# Patient Record
Sex: Female | Born: 2000 | Race: Black or African American | Hispanic: No | Marital: Single | Smoking: Current some day smoker
Health system: Southern US, Community
[De-identification: ages and names within clinical notes are randomized; demographics above are authoritative.]

## PROBLEM LIST (undated history)

## (undated) DIAGNOSIS — Z800 Family history of malignant neoplasm of digestive organs: Secondary | ICD-10-CM

## (undated) DIAGNOSIS — K649 Unspecified hemorrhoids: Secondary | ICD-10-CM

## (undated) DIAGNOSIS — F419 Anxiety disorder, unspecified: Secondary | ICD-10-CM

## (undated) DIAGNOSIS — F32A Depression, unspecified: Secondary | ICD-10-CM

## (undated) DIAGNOSIS — Z15068 Genetic susceptibility to other malignant neoplasm of digestive system: Secondary | ICD-10-CM

## (undated) DIAGNOSIS — Z1509 Genetic susceptibility to other malignant neoplasm: Secondary | ICD-10-CM

## (undated) DIAGNOSIS — T7840XA Allergy, unspecified, initial encounter: Secondary | ICD-10-CM

## (undated) HISTORY — DX: Allergy, unspecified, initial encounter: T78.40XA

## (undated) HISTORY — DX: Unspecified hemorrhoids: K64.9

## (undated) HISTORY — DX: Anxiety disorder, unspecified: F41.9

## (undated) HISTORY — PX: TONSILLECTOMY: SUR1361

## (undated) HISTORY — DX: Genetic susceptibility to other malignant neoplasm of digestive system: Z15.068

## (undated) HISTORY — DX: Family history of malignant neoplasm of digestive organs: Z80.0

## (undated) HISTORY — DX: Depression, unspecified: F32.A

## (undated) HISTORY — DX: Genetic susceptibility to other malignant neoplasm: Z15.09

---
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
## 2018-02-01 ENCOUNTER — Ambulatory Visit: Payer: 59 | Admitting: Internal Medicine

## 2018-02-09 ENCOUNTER — Ambulatory Visit: Payer: 59 | Admitting: Internal Medicine

## 2018-02-09 DIAGNOSIS — Z0289 Encounter for other administrative examinations: Secondary | ICD-10-CM

## 2018-02-22 ENCOUNTER — Ambulatory Visit (INDEPENDENT_AMBULATORY_CARE_PROVIDER_SITE_OTHER): Payer: 59

## 2018-02-22 DIAGNOSIS — Z23 Encounter for immunization: Secondary | ICD-10-CM

## 2018-02-24 ENCOUNTER — Ambulatory Visit: Payer: Self-pay | Admitting: Nurse Practitioner

## 2018-03-02 ENCOUNTER — Ambulatory Visit: Payer: 59 | Admitting: Nurse Practitioner

## 2018-03-02 ENCOUNTER — Encounter: Payer: Self-pay | Admitting: Nurse Practitioner

## 2018-03-02 VITALS — BP 120/90 | HR 100 | Temp 98.2°F | Ht 64.03 in | Wt 128.0 lb

## 2018-03-02 DIAGNOSIS — K149 Disease of tongue, unspecified: Secondary | ICD-10-CM

## 2018-03-02 NOTE — Progress Notes (Signed)
Alicia Li is a 17 y.o. female with the following history as recorded in [facility]:  Patient Active Problem List   Diagnosis Date Noted  . Elevated LFTs 07/14/2017  . Fatigue 07/13/2017  . Neurodermatitis 06/22/2017  . Psychophysiological insomnia 06/21/2017  . MDD (major depressive disorder), recurrent severe, without psychosis ([place]) 04/14/2017  . Family history of Lynch syndrome   . Family history of colon cancer   . Neuropathy involving both lower extremities 05/26/2016  . Routine general medical examination at a health care facility 01/30/2013    Current Outpatient Medications  Medication Sig Dispense Refill  . clindamycin (CLINDAGEL) 1 % gel     . tretinoin (RETIN-A) 0.025 % gel      No current facility-administered medications for this visit.     Allergies: Gabapentin  Past Medical History:  Diagnosis Date  . Family history of colon cancer   . Family history of Lynch syndrome     History reviewed. No pertinent surgical history.  Family History  Problem Relation Age of Onset  . Colon polyps Mother   . Other Mother        Lynch syndrome - PMS2 pos  . Colon cancer Maternal Grandfather 38  . Heart disease Paternal Grandfather   . Other Maternal Aunt        PMS2 neg  . Colon cancer Other 33       MGFs brother  . Colon cancer Other 68       MGF's brother  . Colon cancer Other 56       MGFs sister - MLH1 pos    Social History   Tobacco Use  . Smoking status: Never Smoker  . Smokeless tobacco: Never Used  . Tobacco comment: patient admitted to smoking 1 black and white cigar/day  Substance Use Topics  . Alcohol use: No     Subjective:  Ms grade is here today for evaluation of acute complaint of "bleeding tongue." she tells me that she has always had "bumps" on the back of her tongue and sometimes her tongue "looks white" she saw her dentist for this and was told to brush tongue regularly, which she has started doing but notices her tongue bleeds after she  brushes it, also using tongue scraper. The bleeding last for short time and stops. There is no bleeding now.  She denies fevers, chills, trouble swallowing, loss of appetite, tongue pain, mouth pain.  ROS- see HPI  Objective:  Vitals:   03/02/18 1434 03/02/18 1458  BP: 120/90   Pulse: (!) 108 100  Temp: 98.2 F (36.8 C)   TempSrc: Oral   SpO2: 99%   Weight: 128 lb (58.1 kg)   Height: 5' 4.03" (1.626 m)     General: Well developed, well nourished, in no acute distress  Skin : Warm and dry.  Head: Normocephalic and atraumatic  Eyes: Sclera and conjunctiva clear; pupils round and reactive to light; extraocular movements intact  Mouth/Throat: Oropharynx is clear and moist. No oral lesions. No uvula swelling. tongue is not bleeding or inflamed. Neck: Supple Lungs: effort unlabored, no respiratory distress CVS exam: normal rate and regular rhythm.  Extremities: No edema, cyanosis Vessels: Symmetric bilaterally  Neurologic: Alert and oriented; speech intact; face symmetrical; moves all extremities well; CNII-XII intact without focal deficit  Psychiatric: Normal mood and affect.  Assessment:  1. Tongue irritation     Plan:   Normal PE today Suspect bleeding is related to mechanical irritation She was instructed to hold tongue  brushing/scraping for 2-3 days, resume gently only She will f/u for new, worsening or persistent symptoms Will consider ENT referral if tongue bleeding persists  No follow-ups on file.  No orders of the defined types were placed in this encounter.   Requested Prescriptions    No prescriptions requested or ordered in this encounter

## 2018-03-02 NOTE — Patient Instructions (Addendum)
Hold off on tongue brushing/scraping for a few days  When you resume, gently brush.  Please follow up if you develop pain, if bleeding persists.

## 2018-11-22 ENCOUNTER — Other Ambulatory Visit: Payer: Self-pay

## 2018-11-22 ENCOUNTER — Other Ambulatory Visit (INDEPENDENT_AMBULATORY_CARE_PROVIDER_SITE_OTHER): Payer: 59

## 2018-11-22 ENCOUNTER — Ambulatory Visit (INDEPENDENT_AMBULATORY_CARE_PROVIDER_SITE_OTHER): Payer: 59 | Admitting: Internal Medicine

## 2018-11-22 ENCOUNTER — Encounter: Payer: Self-pay | Admitting: Internal Medicine

## 2018-11-22 VITALS — BP 121/78 | HR 90 | Temp 98.2°F | Resp 16 | Ht 64.8 in | Wt 126.5 lb

## 2018-11-22 DIAGNOSIS — R945 Abnormal results of liver function studies: Secondary | ICD-10-CM

## 2018-11-22 DIAGNOSIS — R7989 Other specified abnormal findings of blood chemistry: Secondary | ICD-10-CM

## 2018-11-22 DIAGNOSIS — Z23 Encounter for immunization: Secondary | ICD-10-CM

## 2018-11-22 DIAGNOSIS — B001 Herpesviral vesicular dermatitis: Secondary | ICD-10-CM | POA: Insufficient documentation

## 2018-11-22 LAB — HEPATIC FUNCTION PANEL
ALT: 10 U/L (ref 0–35)
AST: 15 U/L (ref 0–37)
Albumin: 5 g/dL (ref 3.5–5.2)
Alkaline Phosphatase: 51 U/L (ref 47–119)
Bilirubin, Direct: 0.1 mg/dL (ref 0.0–0.3)
Total Bilirubin: 0.9 mg/dL (ref 0.2–1.2)
Total Protein: 8.1 g/dL (ref 6.0–8.3)

## 2018-11-22 MED ORDER — VALACYCLOVIR HCL 1 G PO TABS: 2000.0000 mg | ORAL_TABLET | Freq: Two times a day (BID) | ORAL | 1 refills | Status: AC

## 2018-11-22 NOTE — Patient Instructions (Signed)
Cold Sore  A cold sore, also called a fever blister, is a small, fluid-filled sore that forms inside the mouth or on the lips, gums, nose, chin, or cheeks. Cold sores can spread to other parts of the body, such as the eyes or fingers. In some people who have other medical conditions, cold sores can spread to multiple other body sites, including the genitals. Cold sores can spread from person to person (are contagious) until the sores crust over completely. Most cold sores go away within 2 weeks. What are the causes? Cold sores are caused by an infection from a common type of herpes simplex virus (HSV-1). HSV-1 is closely related to the HSV-2virus, which is the virus that causes genital herpes, but these viruses are not the same. Once a person is infected with HSV-1, the virus remains permanently in the body. HSV-1 is spread from person to person through close contact, such as through kissing, touching the affected area, or sharing personal items such as lip balm, razors, a drinking glass, or eating utensils. What increases the risk? You are more likely to develop this condition if you:  Are tired, stressed, or sick.  Are menstruating.  Are pregnant.  Take certain medicines.  Are exposed to cold weather or too much sun. What are the signs or symptoms? Symptoms of a cold sore outbreak go through different stages. These are the stages of a cold sore:  Tingling, itching, or burning is felt 1-2 days before the outbreak.  Fluid-filled blisters appear on the lips, inside the mouth, on the nose, or on the cheeks.  The blisters start to ooze clear fluid.  The blisters dry up, and a yellow crust appears in their place.  The crust falls off. In some cases, other symptoms can develop during a cold sore outbreak. These can include:  Fever.  Sore throat.  Headache.  Muscle aches.  Swollen neck glands. How is this diagnosed? This condition is diagnosed based on your medical history and a  physical exam. Your health care provider may do a blood test or may swab some fluid from your sore and then examine the swab in the lab. How is this treated? There is no cure for cold sores or HSV-1. There is also no vaccine for HSV-1. Most cold sores go away on their own without treatment within 2 weeks. Medicines cannot make the infection go away, but your health care provider may prescribe medicines to:  Help relieve some of the pain associated with the sores.  Work to stop the virus from multiplying.  Shorten healing time. Medicines may be in the form of creams, gels, pills, or a shot. Follow these instructions at home: Medicines  Take or apply over-the-counter and prescription medicines only as told by your health care provider.  Use a cotton-tip swab to apply creams or gels to your sores.  Ask your health care provider if you can take lysine supplements. Research has found that lysine may help heal the cold sore faster and prevent outbreaks. Sore care   Do not touch the sores or pick the scabs.  Wash your hands often. Do not touch your eyes without washing your hands first.  Keep the sores clean and dry.  If directed, apply ice to the sores: ? Put ice in a plastic bag. ? Place a towel between your skin and the bag. ? Leave the ice on for 20 minutes, 2-3 times a day. Eating and drinking  Eat a soft, bland diet. Avoid eating   hot, cold, or salty foods.  Use a straw if it hurts to drink out of a glass.  Eat foods that are rich in lysine, such as meat, fish, and dairy products.  Avoid sugary foods, chocolates, nuts, and grains. These foods are rich in a nutrient called arginine, which can cause the virus to multiply. Lifestyle  Do not kiss, have oral sex, or share personal items until your sores heal.  Stress, poor sleep, and being out in the sun can trigger outbreaks. Make sure you: ? Do activities that help you relax, such as deep breathing exercises or meditation. ?  Get enough sleep. ? Apply sunscreen on your lips before you go out in the sun. Contact a health care provider if:  You have symptoms for more than 2 weeks.  You have pus coming from the sores.  You have redness that is spreading.  You have pain or irritation in your eye.  You get sores on your genitals.  Your sores do not heal within 2 weeks.  You have frequent cold sore outbreaks. Get help right away if you have:  A fever and your symptoms suddenly get worse.  A headache and confusion.  Fatigue or loss of appetite.  A stiff neck or sensitivity to light. Summary  A cold sore, also called a fever blister, is a small, fluid-filled sore that forms inside the mouth or on the lips, gums, nose, chin, or cheeks.  Most cold sores go away on their own without treatment within 2 weeks. Your health care provider may prescribe medicines to help relieve some of the pain, work to stop the virus from multiplying, and shorten healing time.  Wash your hands often. Do not touch your eyes without washing your hands first.  Do not kiss, have oral sex, or share personal items until your sores heal.  Contact a health care provider if your sores do not heal within 2 weeks. This information is not intended to replace advice given to you by your health care provider. Make sure you discuss any questions you have with your health care provider. Document Released: 03/06/2000 Document Revised: 06/29/2018 Document Reviewed: 08/09/2017 Elsevier Patient Education  2020 [employer].

## 2018-11-22 NOTE — Progress Notes (Signed)
Subjective:  Patient ID: Alicia Li, female    DOB: 05/19/2000  Age: 18 y.o. MRN: [id number]  CC: Rash   HPI Kara Drilling presents for f/up - She has a history of elevated liver enzymes.  She denies any recent episodes of abdominal pain, nausea, vomiting, or fatigue.  She does not drink alcohol.  She complains that she breaks out in cold sores on her lower lips when she gets her menstrual cycle.  The last episode was a few weeks ago.  Outpatient Medications Prior to Visit  Medication Sig Dispense Refill  . clindamycin (CLINDAGEL) 1 % gel     . tretinoin (RETIN-A) 0.025 % gel      No facility-administered medications prior to visit.     ROS Review of Systems  Constitutional: Negative.  Negative for chills, fatigue and fever.  HENT: Negative.   Eyes: Negative for visual disturbance.  Respiratory: Negative for cough, chest tightness, shortness of breath and wheezing.   Cardiovascular: Negative for chest pain, palpitations and leg swelling.  Gastrointestinal: Negative for abdominal pain, diarrhea, nausea and vomiting.  Endocrine: Negative.   Genitourinary: Negative.  Negative for difficulty urinating.  Musculoskeletal: Negative.  Negative for arthralgias.  Skin: Negative.  Negative for color change and pallor.  Neurological: Negative.  Negative for dizziness, weakness and light-headedness.  Hematological: Negative for adenopathy. Does not bruise/bleed easily.  Psychiatric/Behavioral: Negative.     Objective:  BP 121/78 (BP Location: Right Arm, Patient Position: Sitting, Cuff Size: Small)   Pulse 90   Temp 98.2 F (36.8 C) (Tympanic)   Resp 16   Ht 5' 4.8" (1.646 m)   Wt 126 lb 8 oz (57.4 kg)   LMP 11/05/2018   SpO2 99%   BMI 21.18 kg/m   BP Readings from Last 3 Encounters:  11/22/18 121/78  03/02/18 120/90  07/13/17 120/64    Wt Readings from Last 3 Encounters:  11/22/18 126 lb 8 oz (57.4 kg) (47 %, Z= -0.09)*  03/02/18 128 lb (58.1 kg) (52 %, Z= 0.05)*   07/13/17 139 lb (63 kg) (72 %, Z= 0.58)*   * Growth percentiles are based on CDC (Girls, 2-20 Years) data.    Physical Exam Vitals signs reviewed.  HENT:     Nose: Nose normal.     Mouth/Throat:     Mouth: Mucous membranes are moist.     Pharynx: No oropharyngeal exudate.  Eyes:     General: No scleral icterus. Neck:     Musculoskeletal: Normal range of motion. No neck rigidity or muscular tenderness.  Cardiovascular:     Rate and Rhythm: Normal rate and regular rhythm.     Heart sounds: No murmur.  Pulmonary:     Effort: Pulmonary effort is normal.     Breath sounds: No stridor. No wheezing, rhonchi or rales.  Abdominal:     General: Abdomen is flat. Bowel sounds are normal. There is no distension.     Palpations: There is no hepatomegaly or splenomegaly.     Tenderness: There is no abdominal tenderness.  Musculoskeletal: Normal range of motion.     Right lower leg: No edema.     Left lower leg: No edema.  Lymphadenopathy:     Cervical: No cervical adenopathy.  Skin:    General: Skin is warm and dry.     Findings: Rash present.     Comments: There are excoriated vesicles on the vermilion border laterally over the lower lip.  Neurological:     General:  No focal deficit present.     Mental Status: She is alert.  Psychiatric:        Mood and Affect: Mood normal.        Behavior: Behavior normal.     Lab Results  Component Value Date   WBC 8.0 07/13/2017   HGB 13.7 07/13/2017   HCT 41.8 07/13/2017   PLT 274.0 07/13/2017   GLUCOSE 59 (L) 07/13/2017   ALT 10 11/22/2018   AST 15 11/22/2018   NA 139 07/13/2017   K 3.6 07/13/2017   CL 103 07/13/2017   CREATININE 0.85 07/13/2017   BUN 16 07/13/2017   CO2 28 07/13/2017   TSH 3.12 07/13/2017    Dg Finger Index Right  Result Date: 05/18/2017 CLINICAL DATA:  Jammed RIGHT index finger, MCP joint pain for 2 months EXAM: RIGHT INDEX FINGER 2+V COMPARISON:  None FINDINGS: Osseous mineralization normal. Joint spaces  preserved. No acute fracture, dislocation, or bone destruction. IMPRESSION: Normal exam. Electronically Signed   By: Lavonia Dana M.D.   On: 05/18/2017 16:45   Dg Finger Middle Right  Result Date: 05/18/2017 CLINICAL DATA:  Pain at proximal portion of RIGHT middle phalanx for 2 months, trauma about 3 weeks ago EXAM: RIGHT MIDDLE FINGER 2+V COMPARISON:  None FINDINGS: Osseous mineralization normal. Joint spaces preserved. No acute fracture, dislocation, or bone destruction. IMPRESSION: Normal exam. Electronically Signed   By: Lavonia Dana M.D.   On: 05/18/2017 16:46    Assessment & Plan:   Tametha was seen today for rash.  Diagnoses and all orders for this visit:  Elevated LFTs- Her liver enzymes are normal now.  Screening for viral hepatitis is negative.  She has adequate immunity to hep A and B. -     Hepatic function panel; Future -     Hepatitis B surface antigen; Future -     Hepatitis B surface antibody,quantitative; Future -     Hepatitis A antibody, total; Future -     Hepatitis B core antibody, total; Future -     Hepatitis C antibody; Future  Herpes labialis without complication -     valACYclovir (VALTREX) 1000 MG tablet; Take 2 tablets (2,000 mg total) by mouth 2 (two) times daily for 1 day.  Need for influenza vaccination -     Flu Vaccine QUAD 36+ mos IM   I am having [name] start on valACYclovir. I am also having her maintain her tretinoin and clindamycin.  Meds ordered this encounter  Medications  . valACYclovir (VALTREX) 1000 MG tablet    Sig: Take 2 tablets (2,000 mg total) by mouth 2 (two) times daily for 1 day.    Dispense:  12 tablet    Refill:  1     Follow-up: Return if symptoms worsen or fail to improve.  [name], MD

## 2018-11-23 ENCOUNTER — Encounter: Payer: Self-pay | Admitting: Internal Medicine

## 2018-11-23 LAB — HEPATITIS A ANTIBODY, TOTAL: Hepatitis A AB,Total: REACTIVE — AB

## 2018-11-23 LAB — HEPATITIS B SURFACE ANTIBODY, QUANTITATIVE: Hep B S AB Quant (Post): 60 m[IU]/mL (ref >=10)

## 2018-11-23 LAB — HEPATITIS B CORE ANTIBODY, TOTAL: Hep B Core Total Ab: NONREACTIVE

## 2018-11-23 LAB — HEPATITIS C ANTIBODY
Hepatitis C Ab: NONREACTIVE
SIGNAL TO CUT-OFF: 0.01 (ref <1.00)

## 2018-11-23 LAB — HEPATITIS B SURFACE ANTIGEN: Hepatitis B Surface Ag: NONREACTIVE
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
## 2019-02-23 ENCOUNTER — Other Ambulatory Visit: Payer: Self-pay

## 2019-02-23 DIAGNOSIS — Z20822 Contact with and (suspected) exposure to covid-19: Secondary | ICD-10-CM

## 2019-02-26 LAB — NOVEL CORONAVIRUS, NAA: SARS-CoV-2, NAA: NOT DETECTED

## 2019-03-14 ENCOUNTER — Other Ambulatory Visit: Payer: 59

## 2019-03-14 ENCOUNTER — Ambulatory Visit: Payer: 59 | Attending: Internal Medicine

## 2019-03-14 DIAGNOSIS — Z20822 Contact with and (suspected) exposure to covid-19: Secondary | ICD-10-CM

## 2019-03-15 LAB — NOVEL CORONAVIRUS, NAA: SARS-CoV-2, NAA: NOT DETECTED

## 2019-04-03 ENCOUNTER — Ambulatory Visit (INDEPENDENT_AMBULATORY_CARE_PROVIDER_SITE_OTHER): Payer: 59 | Admitting: Psychology

## 2019-04-03 DIAGNOSIS — F411 Generalized anxiety disorder: Secondary | ICD-10-CM

## 2019-04-06 ENCOUNTER — Encounter: Payer: Self-pay | Admitting: Internal Medicine

## 2019-04-12 ENCOUNTER — Ambulatory Visit: Payer: 59 | Admitting: Psychology

## 2019-04-28 ENCOUNTER — Other Ambulatory Visit: Payer: 59

## 2019-05-03 ENCOUNTER — Other Ambulatory Visit: Payer: 59

## 2019-05-23 ENCOUNTER — Other Ambulatory Visit: Payer: Self-pay

## 2019-05-23 ENCOUNTER — Encounter: Payer: Self-pay | Admitting: Family

## 2019-05-23 ENCOUNTER — Ambulatory Visit (INDEPENDENT_AMBULATORY_CARE_PROVIDER_SITE_OTHER): Payer: 59 | Admitting: Family

## 2019-05-23 VITALS — BP 120/82 | HR 97 | Temp 98.0°F | Ht 64.8 in | Wt 117.6 lb

## 2019-05-23 DIAGNOSIS — R29818 Other symptoms and signs involving the nervous system: Secondary | ICD-10-CM | POA: Diagnosis not present

## 2019-05-23 DIAGNOSIS — S0990XA Unspecified injury of head, initial encounter: Secondary | ICD-10-CM
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
## 2019-05-24 ENCOUNTER — Other Ambulatory Visit: Payer: 59

## 2019-05-24 ENCOUNTER — Ambulatory Visit (INDEPENDENT_AMBULATORY_CARE_PROVIDER_SITE_OTHER)
Admission: RE | Admit: 2019-05-24 | Discharge: 2019-05-24 | Disposition: A | Discharge status: Home / Self Care | Payer: 59 | Source: Ambulatory Visit | Attending: Family | Admitting: Family

## 2019-05-24 DIAGNOSIS — R29818 Other symptoms and signs involving the nervous system: Secondary | ICD-10-CM | POA: Diagnosis not present

## 2019-08-17 ENCOUNTER — Ambulatory Visit: Payer: 59 | Attending: Internal Medicine

## 2019-08-30 ENCOUNTER — Ambulatory Visit: Payer: 59 | Admitting: Family

## 2019-08-31 ENCOUNTER — Ambulatory Visit: Payer: 59 | Admitting: Internal Medicine

## 2019-08-31 DIAGNOSIS — Z0289 Encounter for other administrative examinations: Secondary | ICD-10-CM

## 2019-11-28 ENCOUNTER — Ambulatory Visit: Payer: 59 | Admitting: Neurology

## 2020-01-16 ENCOUNTER — Ambulatory Visit: Payer: 59 | Admitting: Neurology

## 2020-01-16 ENCOUNTER — Telehealth: Payer: Self-pay | Admitting: Neurology

## 2020-01-16 ENCOUNTER — Encounter: Payer: Self-pay | Admitting: Neurology

## 2020-01-16 NOTE — Telephone Encounter (Signed)
This patient did not show for a RV appointment today.

## 2020-04-05 ENCOUNTER — Other Ambulatory Visit: Payer: 59

## 2020-04-06 ENCOUNTER — Other Ambulatory Visit: Payer: 59

## 2020-04-11 ENCOUNTER — Other Ambulatory Visit: Payer: 59

## 2020-04-11 ENCOUNTER — Other Ambulatory Visit: Payer: Self-pay

## 2020-04-11 DIAGNOSIS — Z20822 Contact with and (suspected) exposure to covid-19: Secondary | ICD-10-CM

## 2020-04-13 LAB — NOVEL CORONAVIRUS, NAA: SARS-CoV-2, NAA: NOT DETECTED

## 2020-04-13 LAB — SARS-COV-2, NAA 2 DAY TAT

## 2020-05-10 ENCOUNTER — Other Ambulatory Visit: Payer: Self-pay

## 2020-05-10 ENCOUNTER — Telehealth: Payer: 59 | Admitting: Family

## 2020-05-10 ENCOUNTER — Telehealth (INDEPENDENT_AMBULATORY_CARE_PROVIDER_SITE_OTHER): Payer: 59 | Admitting: Family Medicine

## 2020-05-10 DIAGNOSIS — J029 Acute pharyngitis, unspecified: Secondary | ICD-10-CM | POA: Diagnosis not present

## 2020-05-10 DIAGNOSIS — R599 Enlarged lymph nodes, unspecified: Secondary | ICD-10-CM | POA: Diagnosis not present

## 2020-05-10 NOTE — Progress Notes (Signed)
Virtual Visit via Video Note  I connected with Alicia Li  on 05/10/20 at  3:00 PM EST by a video enabled telemedicine application and verified that I am speaking with the correct person using two identifiers.  Location patient: home,  Location provider:work or home office Persons participating in the virtual visit: patient, provider  I discussed the limitations of evaluation and management by telemedicine and the availability of in person appointments. The patient expressed understanding and agreed to proceed.   HPI:  Acute telemedicine visit for a sore throat: -Onset: 2 days ago -Symptoms include: sore throat and swollen glands in ant neck -did a covid home test yesterday which was negative -Denies: fevers, cough, nasal congestion, inability to eat/drink, SOB, sick contacts -Has tried:analgesic -Pertinent past medical history: tonsillectomy -Pertinent medication allergies: gabapentin -COVID-19 vaccine status: x 2 doses  ROS: See pertinent positives and negatives per HPI.  Past Medical History:  Diagnosis Date  . Family history of colon cancer   . Family history of Lynch syndrome     No past surgical history on file.   Current Outpatient Medications:  .  clindamycin (CLINDAGEL) 1 % gel, , Disp: , Rfl:  .  tretinoin (RETIN-A) 0.025 % gel, , Disp: , Rfl:   EXAM:  VITALS per patient if applicable:  GENERAL: alert, oriented, appears well and in no acute distress  HEENT: atraumatic, conjunttiva clear, no obvious abnormalities on inspection of external nose and ears, on limited video visit exam, has some posterior oropharyngeal erythema, mucous membranes are moist  NECK: normal movements of the head and neck, on patient self exam she notes tender lymphadenopathy bilaterally in the anterior cervical region  LUNGS: on inspection no signs of respiratory distress, breathing rate appears normal, no obvious gross SOB, gasping or wheezing  CV: no obvious cyanosis  MS: moves all  visible extremities without noticeable abnormality  PSYCH/NEURO: pleasant and cooperative, no obvious depression or anxiety, speech and thought processing grossly intact  ASSESSMENT AND PLAN:  Discussed the following assessment and plan:  Sore throat  Glands swollen  -we discussed possible serious and likely etiologies, options for evaluation and workup, limitations of telemedicine visit vs in person visit, treatment, treatment risks and precautions. Pt prefers to treat via telemedicine empirically rather than in person at this moment.  Query viral pharyngitis, COVID-19, mononucleosis, versus other.  Strep throat a possibility.  She wants an accurate diagnosis and has opted to [facility] for further evaluation after lengthy discussion various etiologies, testing options and treatment.  . Discussed options for inperson care if PCP office not available. Did let this patient know that I only do telemedicine on Tuesdays and Thursdays for [facility]. Advised to schedule follow up visit with PCP or [facility] if any further questions or concerns to avoid delays in care.   I discussed the assessment and treatment plan with the patient. The patient was provided an opportunity to ask questions and all were answered. The patient agreed with the plan and demonstrated an understanding of the instructions.     Lucretia Kern, DO

## 2020-05-10 NOTE — Progress Notes (Signed)
I tried on multiple occasions to reach out to the patient for her virtual visit scheduled at 1:20 today. She was sent multiple links to join the visit through [facility]. Her cell phone did not allow for VM. I left a message around 2:20 pm on the home phone to call back if she still needed the appointment. I call back one more time at 4:20 and was still unable to reach the patient.

## 2020-05-15 ENCOUNTER — Encounter (HOSPITAL_COMMUNITY): Payer: Self-pay | Admitting: Emergency Medicine

## 2020-05-15 ENCOUNTER — Ambulatory Visit (HOSPITAL_COMMUNITY)
Admission: EM | Admit: 2020-05-15 | Discharge: 2020-05-15 | Disposition: A | Discharge status: Home / Self Care | Payer: 59 | Attending: Urgent Care | Admitting: Urgent Care

## 2020-05-15 ENCOUNTER — Encounter: Payer: Self-pay | Admitting: Internal Medicine

## 2020-05-15 ENCOUNTER — Telehealth: Payer: Self-pay | Admitting: Internal Medicine

## 2020-05-15 ENCOUNTER — Other Ambulatory Visit: Payer: Self-pay

## 2020-05-15 ENCOUNTER — Other Ambulatory Visit: Payer: Self-pay | Admitting: Internal Medicine

## 2020-05-15 ENCOUNTER — Other Ambulatory Visit: Payer: 59

## 2020-05-15 DIAGNOSIS — J029 Acute pharyngitis, unspecified: Secondary | ICD-10-CM

## 2020-05-15 DIAGNOSIS — R000 Tachycardia, unspecified: Secondary | ICD-10-CM

## 2020-05-15 DIAGNOSIS — R590 Localized enlarged lymph nodes: Secondary | ICD-10-CM

## 2020-05-15 DIAGNOSIS — J0100 Acute maxillary sinusitis, unspecified: Secondary | ICD-10-CM

## 2020-05-15 MED ORDER — NAPROXEN 500 MG PO TABS: 500.0000 mg | ORAL_TABLET | Freq: Two times a day (BID) | ORAL | 0 refills | Status: DC

## 2020-05-15 MED ORDER — FLUCONAZOLE 150 MG PO TABS: 150.0000 mg | ORAL_TABLET | Freq: Once | ORAL | 3 refills | Status: DC

## 2020-05-15 MED ORDER — FLUCONAZOLE 150 MG PO TABS: 150.0000 mg | ORAL_TABLET | Freq: Once | ORAL | 3 refills | Status: AC

## 2020-05-15 MED ORDER — AMOXICILLIN-POT CLAVULANATE 875-125 MG PO TABS: 1.0000 | ORAL_TABLET | Freq: Two times a day (BID) | ORAL | 0 refills | Status: DC

## 2020-05-15 NOTE — ED Provider Notes (Signed)
[facility]   MRN: [id number] DOB: 2000/12/26  Subjective:   Alicia Li is a 20 y.o. female presenting for 1 week history of persistent and worsening left-sided facial pain, upper teeth pain, mouth soreness, bilateral lymph node swelling and pain.  Patient has had 3 visits for this, has tested negative for Covid, strep throat, mono.  She has been using over-the-counter medications, was prescribed a dexamethasone steroid pack and viscous lidocaine none of which helped.  Denies fever, sore throat, chest pain, shortness of breath, cough.  She feels a lot of pressure in her face on the left side when she leans over.  No current facility-administered medications for this encounter.  Current Outpatient Medications:  .  clindamycin (CLINDAGEL) 1 % gel, , Disp: , Rfl:  .  tretinoin (RETIN-A) 0.025 % gel, , Disp: , Rfl:    No Known Allergies  Past Medical History:  Diagnosis Date  . Family history of colon cancer   . Family history of Lynch syndrome      History reviewed. No pertinent surgical history.  Family History  Problem Relation Age of Onset  . Colon polyps Mother   . Other Mother        Lynch syndrome - PMS2 pos  . Colon cancer Maternal Grandfather 61  . Heart disease Paternal Grandfather   . Other Maternal Aunt        PMS2 neg  . Colon cancer Other 94       MGFs brother  . Colon cancer Other 60       MGF's brother  . Colon cancer Other 87       MGFs sister - MLH1 pos    Social History   Tobacco Use  . Smoking status: Never Smoker  . Smokeless tobacco: Never Used  . Tobacco comment: patient admitted to smoking 1 black and white cigar/day  Vaping Use  . Vaping Use: Never used  Substance Use Topics  . Alcohol use: Yes  . Drug use: Yes    Types: Marijuana    ROS   Objective:   Vitals: BP 113/65 (BP Location: Left Arm)   Pulse (!) 107   Temp 98.4 F (36.9 C) (Oral)   Resp 18   LMP 04/06/2020   SpO2 100%   Physical  Exam Constitutional:      General: She is not in acute distress.    Appearance: Normal appearance. She is well-developed. She is ill-appearing. She is not toxic-appearing or diaphoretic.  HENT:     Head: Normocephalic and atraumatic.     Right Ear: Tympanic membrane, ear canal and external ear normal. There is no impacted cerumen.     Left Ear: Tympanic membrane, ear canal and external ear normal. There is no impacted cerumen.     Nose: Nose normal. No congestion or rhinorrhea.     Comments: Left-sided maxillary tenderness    Mouth/Throat:     Mouth: Mucous membranes are moist.     Pharynx: No oropharyngeal exudate or posterior oropharyngeal erythema.     Comments: Significant postnasal drainage overlying pharynx. Eyes:     General: No scleral icterus.    Extraocular Movements: Extraocular movements intact.     Pupils: Pupils are equal, round, and reactive to light.  Cardiovascular:     Rate and Rhythm: Normal rate.  Pulmonary:     Effort: Pulmonary effort is normal.  Skin:    General: Skin is warm and dry.  Neurological:  General: No focal deficit present.     Mental Status: She is alert and oriented to person, place, and time.  Psychiatric:        Mood and Affect: Mood normal.        Behavior: Behavior normal.     Assessment and Plan :   PDMP not reviewed this encounter.  1. Acute non-recurrent maxillary sinusitis   2. Sore throat   3. Cervical lymphadenopathy   4. Tachycardia     Will start empiric treatment for sinusitis with Augmentin.  Naproxen for pain relief.  Recommended supportive care otherwise.  Will defer testing as patient has already had all the common test done including Covid, mono, strep.  Counseled patient on potential for adverse effects with medications prescribed/recommended today, ER and return-to-clinic precautions discussed, patient verbalized understanding.    Jaynee Eagles, [place] 05/15/20 [id number]

## 2020-05-15 NOTE — Telephone Encounter (Signed)
[facility]:  Patient's mother has called saying her daughter is experiencing pain for the past 2 weeks in her neck and mouth, fatigue, agitation in mouth (spitting out blood)  Mother also states that daughter's lymph nodes are swollen and the other one is hard.  Advised to go to ED. Patient has understood and went to ED immediately.

## 2020-05-15 NOTE — ED Triage Notes (Signed)
2/16, started having throat and mouth soreness.  Patient thought it was her wisdom teeth.  Saw a provider on 2/18.  They provided steroids.  Patient discontinued this medicine due to not following taper instructions.  Since then left side fo face hurts, lymph nodes hurting , headache, mouth bleeding with brushing of teeth and has red spots in mouth per patient

## 2020-05-17 ENCOUNTER — Ambulatory Visit: Payer: 59 | Admitting: Family

## 2020-06-05 ENCOUNTER — Encounter: Payer: 59 | Admitting: Internal Medicine

## 2020-06-05 DIAGNOSIS — Z0289 Encounter for other administrative examinations: Secondary | ICD-10-CM

## 2020-06-12 ENCOUNTER — Encounter: Payer: 59 | Admitting: Internal Medicine

## 2020-06-24 ENCOUNTER — Ambulatory Visit (INDEPENDENT_AMBULATORY_CARE_PROVIDER_SITE_OTHER): Payer: 59 | Admitting: Internal Medicine

## 2020-06-24 ENCOUNTER — Encounter: Payer: Self-pay | Admitting: Internal Medicine

## 2020-06-24 ENCOUNTER — Other Ambulatory Visit: Payer: Self-pay

## 2020-06-24 VITALS — BP 102/70 | HR 92 | Temp 98.7°F | Ht 64.8 in | Wt 117.8 lb

## 2020-06-24 DIAGNOSIS — Z23 Encounter for immunization: Secondary | ICD-10-CM

## 2020-06-24 DIAGNOSIS — L280 Lichen simplex chronicus: Secondary | ICD-10-CM | POA: Diagnosis not present

## 2020-06-24 MED ORDER — PREGABALIN 25 MG PO CAPS: 25.0000 mg | ORAL_CAPSULE | Freq: Three times a day (TID) | ORAL | 0 refills | Status: DC

## 2020-06-24 NOTE — Progress Notes (Signed)
Subjective:  Patient ID: Alicia Li, female    DOB: [birth year]  Age: 20 y.o. MRN: [id number]  CC: Follow-up  This visit occurred during the SARS-CoV-2 public health emergency.  Safety protocols were in place, including screening questions prior to the visit, additional usage of staff PPE, and extensive cleaning of exam room while observing appropriate contact time as indicated for disinfecting solutions.    HPI Callee Rohrig presents for f/up -  She complains of a recurrence of nerve discomfort in her feet - she describes it as a "deep itch". She saw a neurologist years ago and the workup was unremarkable. The sx's were attributed to neurodermatitis. She has responded to gabapentin in the past but this is not covered on her current insurance.  Outpatient Medications Prior to Visit  Medication Sig Dispense Refill  . clindamycin (CLINDAGEL) 1 % gel     . naproxen (NAPROSYN) 500 MG tablet Take 1 tablet (500 mg total) by mouth 2 (two) times daily with a meal. 30 tablet 0  . tretinoin (RETIN-A) 0.025 % gel     . amoxicillin-clavulanate (AUGMENTIN) 875-125 MG tablet Take 1 tablet by mouth every 12 (twelve) hours. (Patient not taking: Reported on 06/24/2020) 14 tablet 0   No facility-administered medications prior to visit.    ROS Review of Systems  Constitutional: Negative for diaphoresis and fatigue.  HENT: Negative.   Eyes: Negative for visual disturbance.  Respiratory: Negative for cough, chest tightness, shortness of breath and wheezing.   Cardiovascular: Negative for chest pain, palpitations and leg swelling.  Gastrointestinal: Negative for abdominal pain, constipation and diarrhea.  Endocrine: Negative.   Genitourinary: Negative.  Negative for difficulty urinating.  Musculoskeletal: Negative for arthralgias and myalgias.  Skin: Negative for color change.  Neurological: Negative for dizziness, weakness, light-headedness and numbness.  Hematological: Negative for adenopathy.  Does not bruise/bleed easily.  Psychiatric/Behavioral: Negative.     Objective:  BP 102/70 (BP Location: Right Arm, Patient Position: Sitting, Cuff Size: Normal)   Pulse 92   Temp 98.7 F (37.1 C) (Oral)   Ht 5' 4.8" (1.646 m)   Wt 117 lb 12.8 oz (53.4 kg)   LMP 06/01/2020   SpO2 100%   BMI 19.72 kg/m   BP Readings from Last 3 Encounters:  06/24/20 102/70  05/15/20 113/65  05/23/19 120/82    Wt Readings from Last 3 Encounters:  06/24/20 117 lb 12.8 oz (53.4 kg)  05/23/19 117 lb 9.6 oz (53.3 kg)  11/22/18 126 lb 8 oz (57.4 kg) (47 %, Z= -0.09)*   * Growth percentiles are based on CDC (Girls, 2-20 Years) data.    Physical Exam Vitals reviewed.  HENT:     Nose: Nose normal.     Mouth/Throat:     Mouth: Mucous membranes are moist.  Eyes:     General: No scleral icterus.    Conjunctiva/sclera: Conjunctivae normal.  Cardiovascular:     Rate and Rhythm: Normal rate and regular rhythm.     Pulses:          Dorsalis pedis pulses are 1+ on the right side and 1+ on the left side.       Posterior tibial pulses are 1+ on the right side and 1+ on the left side.  Pulmonary:     Effort: Pulmonary effort is normal.     Breath sounds: No stridor. No wheezing, rhonchi or rales.  Abdominal:     General: Abdomen is flat. Bowel sounds are normal. There is no distension.  Palpations: Abdomen is soft. There is no hepatomegaly, splenomegaly or mass.  Musculoskeletal:     Cervical back: Neck supple.     Right lower leg: No edema.     Left lower leg: No edema.  Feet:     Right foot:     Skin integrity: Skin integrity normal.     Left foot:     Skin integrity: Skin integrity normal.  Lymphadenopathy:     Cervical: No cervical adenopathy.  Skin:    General: Skin is warm and dry.     Coloration: Skin is not jaundiced.     Findings: No rash.  Neurological:     General: No focal deficit present.     Mental Status: She is alert and oriented to person, place, and time. Mental  status is at baseline.  Psychiatric:        Mood and Affect: Mood normal.        Behavior: Behavior normal.     Lab Results  Component Value Date   WBC 8.0 07/13/2017   HGB 13.7 07/13/2017   HCT 41.8 07/13/2017   PLT 274.0 07/13/2017   GLUCOSE 59 (L) 07/13/2017   ALT 10 11/22/2018   AST 15 11/22/2018   NA 139 07/13/2017   K 3.6 07/13/2017   CL 103 07/13/2017   CREATININE 0.85 07/13/2017   BUN 16 07/13/2017   CO2 28 07/13/2017   TSH 3.12 07/13/2017    No results found.  Assessment & Plan:   [name] was seen today for follow-up.  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 Will increase the dose over the next few months as tolerated. -     pregabalin (LYRICA) 25 MG capsule; Take 1 capsule (25 mg total) by mouth 3 (three) times daily.  Other orders -     Tdap vaccine greater than or equal to 7yo IM   I have discontinued Siedah Kanouse's amoxicillin-clavulanate. I am also having her start on pregabalin. Additionally, I am having her maintain her tretinoin, clindamycin, and naproxen.  Meds ordered this encounter  Medications  . pregabalin (LYRICA) 25 MG capsule    Sig: Take 1 capsule (25 mg total) by mouth 3 (three) times daily.    Dispense:  90 capsule    Refill:  0     Follow-up: Return in about 6 months (around 12/24/2020).  [name], MD

## 2020-06-24 NOTE — Patient Instructions (Signed)
Neuropathic Pain Neuropathic pain is pain caused by damage to the nerves that are responsible for certain sensations in your body (sensory nerves). The pain can be caused by:  Damage to the sensory nerves that send signals to your spinal cord and brain (peripheral nervous system).  Damage to the sensory nerves in your brain or spinal cord (central nervous system). Neuropathic pain can make you more sensitive to pain. Even a minor sensation can feel very painful. This is usually a long-term condition that can be difficult to treat. The type of pain differs from person to person. It may:  Start suddenly (acute), or it may develop slowly and last for a long time (chronic).  Come and go as damaged nerves heal, or it may stay at the same level for years.  Cause emotional distress, loss of sleep, and a lower quality of life. What are the causes? The most common cause of this condition is diabetes. Many other diseases and conditions can also cause neuropathic pain. Causes of neuropathic pain can be classified as:  Toxic. This is caused by medicines and chemicals. The most common cause of toxic neuropathic pain is damage from cancer treatments (chemotherapy).  Metabolic. This can be caused by: ? Diabetes. This is the most common disease that damages the nerves. ? Lack of vitamin B from long-term alcohol abuse.  Traumatic. Any injury that cuts, crushes, or stretches a nerve can cause damage and pain. A common example is feeling pain after losing an arm or leg (phantom limb pain).  Compression-related. If a sensory nerve gets trapped or compressed for a long period of time, the blood supply to the nerve can be cut off.  Vascular. Many blood vessel diseases can cause neuropathic pain by decreasing blood supply and oxygen to nerves.  Autoimmune. This type of pain results from diseases in which the body's defense system (immune system) mistakenly attacks sensory nerves. Examples of autoimmune diseases  that can cause neuropathic pain include lupus and multiple sclerosis.  Infectious. Many types of viral infections can damage sensory nerves and cause pain. Shingles infection is a common cause of this type of pain.  Inherited. Neuropathic pain can be a symptom of many diseases that are passed down through families (genetic). What increases the risk? You are more likely to develop this condition if:  You have diabetes.  You smoke.  You drink too much alcohol.  You are taking certain medicines, including medicines that kill cancer cells (chemotherapy) or that treat immune system disorders. What are the signs or symptoms? The main symptom is pain. Neuropathic pain is often described as:  Burning.  Shock-like.  Stinging.  Hot or cold.  Itching. How is this diagnosed? No single test can diagnose neuropathic pain. It is diagnosed based on:  Physical exam and your symptoms. Your health care provider will ask you about your pain. You may be asked to use a pain scale to describe how bad your pain is.  Tests. These may be done to see if you have a high sensitivity to pain and to help find the cause and location of any sensory nerve damage. They include: ? Nerve conduction studies to test how well nerve signals travel through your sensory nerves (electrodiagnostic testing). ? Stimulating your sensory nerves through electrodes on your skin and measuring the response in your spinal cord and brain (somatosensory evoked potential).  Imaging studies, such as: ? X-rays. ? CT scan. ? MRI. How is this treated? Treatment for neuropathic pain may change   over time. You may need to try different treatment options or a combination of treatments. Some options include:  Treating the underlying cause of the neuropathy, such as diabetes, kidney disease, or vitamin deficiencies.  Stopping medicines that can cause neuropathy, such as chemotherapy.  Medicine to relieve pain. Medicines may  include: ? Prescription or over-the-counter pain medicine. ? Anti-seizure medicine. ? Antidepressant medicines. ? Pain-relieving patches that are applied to painful areas of skin. ? A medicine to numb the area (local anesthetic), which can be injected as a nerve block.  Transcutaneous nerve stimulation. This uses electrical currents to block painful nerve signals. The treatment is painless.  Alternative treatments, such as: ? Acupuncture. ? Meditation. ? Massage. ? Physical therapy. ? Pain management programs. ? Counseling. Follow these instructions at home: Medicines  Take over-the-counter and prescription medicines only as told by your health care provider.  Do not drive or use heavy machinery while taking prescription pain medicine.  If you are taking prescription pain medicine, take actions to prevent or treat constipation. Your health care provider may recommend that you: ? Drink enough fluid to keep your urine pale yellow. ? Eat foods that are high in fiber, such as fresh fruits and vegetables, whole grains, and beans. ? Limit foods that are high in fat and processed sugars, such as fried or sweet foods. ? Take an over-the-counter or prescription medicine for constipation.   Lifestyle  Have a good support system at home.  Consider joining a chronic pain support group.  Do not use any products that contain nicotine or tobacco, such as cigarettes and e-cigarettes. If you need help quitting, ask your health care provider.  Do not drink alcohol.   General instructions  Learn as much as you can about your condition.  Work closely with all your health care providers to find the treatment plan that works best for you.  Ask your health care provider what activities are safe for you.  Keep all follow-up visits as told by your health care provider. This is important. Contact a health care provider if:  Your pain treatments are not working.  You are having side effects  from your medicines.  You are struggling with tiredness (fatigue), mood changes, depression, or anxiety. Summary  Neuropathic pain is pain caused by damage to the nerves that are responsible for certain sensations in your body (sensory nerves).  Neuropathic pain may come and go as damaged nerves heal, or it may stay at the same level for years.  Neuropathic pain is usually a long-term condition that can be difficult to treat. Consider joining a chronic pain support group. This information is not intended to replace advice given to you by your health care provider. Make sure you discuss any questions you have with your health care provider. Document Revised: 06/30/2018 Document Reviewed: 03/26/2017 Elsevier Patient Education  2021 [employer].

## 2020-06-26 ENCOUNTER — Inpatient Hospital Stay: Payer: 59

## 2020-06-26 ENCOUNTER — Inpatient Hospital Stay: Payer: 59 | Attending: Genetic Counselor | Admitting: Genetic Counselor

## 2020-06-26 ENCOUNTER — Other Ambulatory Visit: Payer: Self-pay

## 2020-06-26 DIAGNOSIS — Z800 Family history of malignant neoplasm of digestive organs: Secondary | ICD-10-CM | POA: Diagnosis not present

## 2020-06-26 LAB — GENETIC SCREENING ORDER

## 2020-06-27 ENCOUNTER — Ambulatory Visit: Payer: 59

## 2020-06-27 NOTE — Progress Notes (Signed)
REFERRING PROVIDER: Janith Lima, MD [address] [place],   [address]  PRIMARY PROVIDER:  Janith Lima, MD  PRIMARY REASON FOR VISIT:  1. Family history of Lynch syndrome   2. Family history of colon cancer      HISTORY OF PRESENT ILLNESS:   Ms. Parilla, a 20 y.o. female, was seen for a [place] cancer genetics consultation at the request of Dr. Ronnald Ramp due to a family history of Lynch syndrome.  Ms. Sianez presents to clinic today to discuss the possibility of a hereditary predisposition to cancer, genetic testing, and to further clarify her future cancer risks, as well as potential cancer risks for family members.   Ms. Hargan is a 20 y.o. female with no personal history of cancer.  Her mother was found to carry a PMS2 mutation which is indicative for Lynch syndrome.  CANCER HISTORY:  Oncology History   No history exists.     RISK FACTORS:  Menarche was at age 69.  First live birth at age N/A.  OCP use for approximately 0 years.  Ovaries intact: yes.  Hysterectomy: no.  Menopausal status: premenopausal.  HRT use: 0 years. Colonoscopy: no; not examined. Mammogram within the last year: no. Number of breast biopsies: 0. Up to date with pelvic exams: yes. Any excessive radiation exposure in the past: no  Past Medical History:  Diagnosis Date  . Family history of colon cancer   . Family history of Lynch syndrome     No past surgical history on file.  Social History   Socioeconomic History  . Marital status: Single    Spouse name: Not on file  . Number of children: Not on file  . Years of education: Not on file  . Highest education level: Not on file  Occupational History  . Not on file  Tobacco Use  . Smoking status: Never Smoker  . Smokeless tobacco: Never Used  . Tobacco comment: patient admitted to smoking 1 black and white cigar/day  Vaping Use  . Vaping Use: Never used  Substance and Sexual Activity  . Alcohol use: Yes  . Drug use: Yes     Types: Marijuana  . Sexual activity: Not Currently    Partners: Female    Birth control/protection: Abstinence  Other Topics Concern  . Not on file  Social History Narrative  . Not on file   Social Determinants of Health   Financial Resource Strain: Not on file  Food Insecurity: Not on file  Transportation Needs: Not on file  Physical Activity: Not on file  Stress: Not on file  Social Connections: Not on file     FAMILY HISTORY:  We obtained a detailed, 4-generation family history.  Significant diagnoses are listed below: Family History  Problem Relation Age of Onset  . Colon polyps Mother   . Other Mother        Lynch syndrome - PMS2 pos  . Colon cancer Maternal Grandfather 3  . Heart disease Paternal Grandfather   . Other Maternal Aunt        PMS2 neg  . Colon cancer Other 12       MGFs brother  . Colon cancer Other 2       MGF's brother  . Colon cancer Other 83       MGFs sister - MLH1 pos    The patient does not have children.  She has one maternal half brother who is cancer free.  Both parents are living.  The patient's mother has a diagnosis of Lynch syndrome and is being screened appropriately. She has five paternal half siblings - three sisters and two brothers. One sister has undergone testing and was PMS2 negative. The maternal grandparents are alive. The grandfather was diagnosed with colon cancer at 28. He has three sisters and two brothers. Both brothers and one sister had colon cancer, diagnosed between ages 10-51. Reportedly, one of the grandfather's siblings tested positive for an MLH1 mutation. The maternal grandmother is alive and cancer free.  The patient's father is cancer free.  He has two brothers and an adopted sister who are all cancer free.  His mother is living and healthy and his father died of heart disease.  Patient's maternal ancestors are of African American descent, and paternal ancestors are of African American descent. There  is no reported Ashkenazi Jewish ancestry. There is no known consanguinity.  GENETIC COUNSELING ASSESSMENT: Ms. Hurless is a 20 y.o. female with a family history of Lynch syndrome due to a PMS2 pathogenic mutation which causes a predisposition to cancer given the known PMS2 mutation in the family. We, therefore, discussed and recommended the following at today's visit.   DISCUSSION: We discussed that 3% of colon cases are due to Lynch syndrome.  Lynch syndrome is caused by a pathogenic variant in one of five genes, and depending on the gene will determine the risk for cancer.  PMS2 has one of the lower risks for cancer, but increased screening is still necessary.  Based on Ms. Steptoe's mother who has a known pathogenic variant in PMS2, Ms. Kawahara has a 50% chance of also having this same mutation.  If she is found to have this PMS2 mutation, she will need to be followed more closely for both colon and uterine cancer.  We reviewed the characteristics, features and inheritance patterns of hereditary cancer syndromes. We also discussed genetic testing, including the appropriate family members to test, the process of testing, insurance coverage and turn-around-time for results. We discussed the implications of a negative, positive, carrier and/or variant of uncertain significant result. We recommended Ms. Faley pursue genetic testing for the common hereditary cancer gene panel+RNA.  The Common Hereditary Gene Panel offered by [facility] includes sequencing and/or deletion duplication testing of the following 47 genes: APC, ATM, AXIN2, BARD1, BMPR1A, BRCA1, BRCA2, BRIP1, CDH1, CDK4, CDKN2A (p14ARF), CDKN2A (p16INK4a), CHEK2, CTNNA1, DICER1, EPCAM (Deletion/duplication testing only), GREM1 (promoter region deletion/duplication testing only), KIT, MEN1, MLH1, MSH2, MSH3, MSH6, MUTYH, NBN, NF1, NHTL1, PALB2, PDGFRA, PMS2, POLD1, POLE, PTEN, RAD50, RAD51C, RAD51D, SDHB, SDHC, SDHD, SMAD4, SMARCA4. STK11, TP53, TSC1, TSC2,  and VHL.  The following genes were evaluated for sequence changes only: SDHA and HOXB13 c.251G>A variant only.    Based on Ms. Latchford's family history of cancer, she meets medical criteria for genetic testing. Despite that she meets criteria, she may still have an out of pocket cost. We discussed that if her out of pocket cost for testing is over $100, the laboratory will call and confirm whether she wants to proceed with testing.  If the out of pocket cost of testing is less than $100 she will be billed by the genetic testing laboratory.   PLAN: After considering the risks, benefits, and limitations, Ms. Silveri provided informed consent to pursue genetic testing and the blood sample was sent to [facility] for analysis of the common hereditary cancer panel + RNA. Results should be available within approximately 2-3 weeks' time, at which point they will be disclosed  by telephone to Ms. Baynes, as will any additional recommendations warranted by these results. Ms. Kirksey will receive a summary of her genetic counseling visit and a copy of her results once available. This information will also be available in Epic.   Lastly, we encouraged Ms. Wiegel to remain in contact with cancer genetics annually so that we can continuously update the family history and inform her of any changes in cancer genetics and testing that may be of benefit for this family.   Ms. Burnsed questions were answered to her satisfaction today. Our contact information was provided should additional questions or concerns arise. Thank you for the referral and allowing [place] to share in the care of your patient.   Alif Petrak P. Florene Glen, [place], [facility] Licensed, Insurance risk surveyor [email or web address] phone: [phone]  The patient was seen for a total of 40 minutes in face-to-face genetic counseling.  This patient was discussed with Drs. Magrinat, Lindi Adie and/or Burr Medico who agrees with the above.     _______________________________________________________________________ For Office Staff:  Number of people involved in session: 1 Was an Intern/ student involved with case: yes Community education officer

## 2020-07-01 ENCOUNTER — Ambulatory Visit: Payer: 59

## 2020-07-01 ENCOUNTER — Encounter: Payer: Self-pay | Admitting: Internal Medicine

## 2020-07-09 ENCOUNTER — Other Ambulatory Visit (HOSPITAL_BASED_OUTPATIENT_CLINIC_OR_DEPARTMENT_OTHER): Payer: Self-pay

## 2020-07-18 ENCOUNTER — Telehealth: Payer: Self-pay | Admitting: Genetic Counselor

## 2020-07-18 ENCOUNTER — Other Ambulatory Visit (HOSPITAL_BASED_OUTPATIENT_CLINIC_OR_DEPARTMENT_OTHER): Payer: Self-pay

## 2020-07-18 ENCOUNTER — Encounter: Payer: Self-pay | Admitting: Genetic Counselor

## 2020-07-18 ENCOUNTER — Ambulatory Visit: Payer: 59

## 2020-07-18 DIAGNOSIS — Z1379 Encounter for other screening for genetic and chromosomal anomalies: Secondary | ICD-10-CM | POA: Insufficient documentation

## 2020-07-18 NOTE — Telephone Encounter (Signed)
No answering machine.  Could not leave a message.

## 2020-07-19 NOTE — Telephone Encounter (Signed)
Call could not be completed as dialed.  Please call back again.

## 2020-07-19 NOTE — Telephone Encounter (Signed)
Revealed that patient was found to carry the PMS2 mutation identified in her family.  She is scheduled for a return visit on 07/24/2020.

## 2020-07-24 ENCOUNTER — Inpatient Hospital Stay: Payer: 59 | Attending: Genetic Counselor | Admitting: Genetic Counselor

## 2020-07-24 ENCOUNTER — Encounter: Payer: Self-pay | Admitting: Genetic Counselor

## 2020-07-24 DIAGNOSIS — Z800 Family history of malignant neoplasm of digestive organs: Secondary | ICD-10-CM

## 2020-07-24 DIAGNOSIS — Z1379 Encounter for other screening for genetic and chromosomal anomalies: Secondary | ICD-10-CM | POA: Diagnosis not present

## 2020-07-24 DIAGNOSIS — Z1509 Genetic susceptibility to other malignant neoplasm: Secondary | ICD-10-CM

## 2020-07-24 DIAGNOSIS — Z8379 Family history of other diseases of the digestive system: Secondary | ICD-10-CM | POA: Diagnosis not present

## 2020-07-24 DIAGNOSIS — Z8249 Family history of ischemic heart disease and other diseases of the circulatory system: Secondary | ICD-10-CM

## 2020-07-24 NOTE — Progress Notes (Signed)
REFERRING PROVIDER: Janith Lima, MD [address] [place],  [place] [address]  PRIMARY PROVIDER:  Janith Lima, MD  PRIMARY REASON FOR VISIT:  1. Genetic testing   2. PMS2-related Lynch syndrome (HNPCC4)     GENETIC TEST RESULTS   Patient Name: Alicia Li Patient Age: 20 y.o. Encounter Date: 07/24/2020  HPI: Alicia Li was previously seen in the [place] clinic due to a family of cancer and concerns regarding a hereditary predisposition to cancer. Please refer to our prior cancer genetics clinic note for more information regarding Alicia Li's medical, social and family histories, and our assessment and recommendations, at the time. Alicia Li recent genetic test results were disclosed to her, as were recommendations warranted by these results. These results and recommendations are discussed in more detail below.   FAMILY HISTORY:  We obtained a detailed, 4-generation family history.  Significant diagnoses are listed below: Family History  Problem Relation Age of Onset  . Colon polyps Mother   . Other Mother        Lynch syndrome - PMS2 pos  . Colon cancer Maternal Grandfather 67  . Heart disease Paternal Grandfather   . Other Maternal Aunt        PMS2 neg  . Colon cancer Other 67       MGFs brother  . Colon cancer Other 63       MGF's brother  . Colon cancer Other 38       MGFs sister - MLH1 pos    The patient does not have children. She has one maternal half brother who is cancer free. Both parents are living.  The patient's mother has a diagnosis of Lynch syndrome and is being screened appropriately. She has five paternal half siblings - three sisters and two brothers. One sister has undergone testing and was PMS2 negative. The maternal grandparents are alive. The grandfather was diagnosed with colon cancer at 34. He has three sisters and two brothers. Both brothers and one sister had colon cancer, diagnosed between ages 72-51.  Reportedly, one of the grandfather's siblings tested positive for an MLH1 mutation. The maternal grandmother is alive and cancer free.  The patient's father is cancer free. He has two brothers and an adopted sister who are all cancer free. His mother is living and healthy and his father died of heart disease.  Patient's maternal ancestors are Chiropodist, and paternal ancestors are of African Americandescent. There is noreported Ashkenazi Jewish ancestry. There is noknown consanguinity.    GENETIC TESTING:  At the time of Alicia Li's visit, we recommended she pursue genetic testing of the Common Hereditary Cancer + RNA panel. The genetic testing reported out on July 17, 2020 through the Common Hereditary Cancer Panel offered by [facility] identified a single, heterozygous pathogenic gene mutation called PMS2, Deletion (Exon 10) confirming the diagnosis of Lynch syndrome.There were no deleterious mutations in APC, ATM, AXIN2, BARD1, BMPR1A, BRCA1, BRCA2, BRIP1, CDH1, CDK4, CDKN2A, CHEK2, CTNNA1, DICER1, EPCAM, GREM1, HOXB13, KIT, MEN1, MLH1, MSH2, MSH3, MSH6, MUTYH, NBN, NF1, NTHL1, PALB2, PDGFRA, POLD1, POLE, PTEN, RAD50, RAD51C, RAD51D, SDHA, SDHB, SDHC, SDHD, SMAD4, SMARCA4. STK11, TP53, TSC1, TSC2, and VHL.  Genetic testing did identify a variant of uncertain significance (VUS) was identified in the APC gene called c.2461G>A.  At this time, it is unknown if this variant is associated with increased cancer risk or if this is a normal finding, but most variants such as this get reclassified to being inconsequential.  It should not be used to make medical management decisions. With time, we suspect the lab will determine the significance of this variant, if any. If we do learn more about it, we will try to contact Alicia Li to discuss it further. However, it is important to stay in touch with [place] periodically and keep the address and phone number up to date.  A copy of the test  report has been scanned into Epic for review.     SCREENING RECOMMENDATIONS: We discussed the implications of Lynch syndrome for Alicia Li, and discussed who else in the family should have genetic testing. We recommended Alicia Li follow management guidelines for Lynch syndrome; all of which are outlined below. These can be coordinated by Alicia Li or her primary provider.   1. Annual colonoscopy starting at age 60-35 for PMS2.   2. While there is no clear evidence to support screening for stomach and small bowel cancer, an upper endoscopy can be considered. However, whether to have this screening is best determined by the gastroenterologist.   3. For patients with colorectal adenocarcinoma, segmental or extended colectomy may be considered.  For women with Lynch syndrome, unlike the effective surveillance plan for colorectal cancer risk, there is no professional agreement regarding management for the increased risk of uterine and ovarian cancer. For endometrial cancer, women are encouraged to be aware of and immediately report dysfunctional or post-menopausal bleeding, which should then be followed up by an endometrial biopsy. In terms of surveillance, transvaginal ultrasound and endometrial biopsies have not been shown to be effective screening tools; however, they may be considered at the clinician's discretion. Importantly, transvaginal ultrasound is not recommended in pre-menopausal women due to variable presentations throughout a normal menstrual cycle. Endometrial biopsy can be considered every 1-2 years, but does not have proven benefit of reducing mortality in women with Lynch syndrome given the typically early presenting symptoms. Finally, while hysterectomy has not been shown to reduce endometrial cancer mortality, it does reduce incidence, and therefore, can be considered as a risk-reducing option.  Unfortunately, symptoms of early stage ovarian cancer are not as obvious as  endometrial cancer; however, women are encouraged to be aware of symptoms, such as pelvic or abdominal pain, bloating, increased abdominal girth, difficulty eating, feeling full from eating quickly, as well as increased urinary frequency and urgency. In terms of surveillance, transvaginal ultrasound examination and serum CA-125 have not been shown to be sufficiently sensitive or specific to support for routine screening. In terms of risk-reducing surgery, historically, women have been recommended to undergo a bilateral salpingo-oophorectomy (BSO) regardless of gene mutation; however, with the collection of gene-specific data, this recommendation has evolved. The most recent NCCN guidelines (v2.2019) notes the limited data to make specific recommendations for BSO in MSH6 and PMS2 mutation carriers. It is important to note that these guidelines do not cite the most recent article published with gene-specific risks (Dominguez-Valentin et al., 2019), which does quote a 3% ovarian cancer risk by age 40. Therefore, the decision to undergo a BSO, especially with this low-penetrance gene, should be individualized based on personal and family history.  However, we are available to help women and their providers establish an individualized surveillance plan. It is also important for women to understand the following:   1. Women should seek medical attention if they experience abnormal vaginal bleeding.  2. Some providers may still recommend vaginal ultrasounds, uterine biopsies (for uterine cancer risk) and/or CA-125 analysis ( for ovarian cancer risk), even  though these have not been shown to be effective.  3. A hysterectomy with removal of the ovaries and fallopian tubes could be considered once childbearing is completed (if planned).  FAMILY MEMBERS: Since we now know the mutation in Alicia Li, we can test at-risk relatives to determine whether or not they have inherited the mutation and are at increased risk for  cancer.  It is important that all of Alicia Li's relatives (both men and women) know of the presence of this gene mutation. Site-specific genetic testing can sort out who in the family is at risk and who is not. We will be happy to meet with any of the family members or refer them to a genetic counselor in their local area. To locate genetic counselors in other cities, individuals can visit the website of the [employer] (ArtistMovie.se) and Secretary/administrator for a Social worker by zip code.   Alicia Li brother has a 50% chance to have inherited this mutation. We recommend he have genetic testing for this same mutation, as identifying the presence of this mutation would allow them to also take advantage of risk-reducing measures.  Children who inherit two mutations in the same Lynch gene, one mutation from each parent, are at-risk of a rare recessive condition called constitutional mismatch repair deficiency (CMMR-D) syndrome. If family members have this mutation, they may wish to have their partner tested if they are planning on having children.  Alicia Li has inherited her PMS2 mutation from her mother.  Her mother inherited her PMS2 mutation from her mother as well.  However, her father and his family have colon cancer that is the result of an MLH1 mutation.  Alicia Li aunts and uncles are all paternal half siblings to her mother, and therefore should undergo genetic testing for the MLH1 mutation in the paternal family, as they would not be expected to have a PMS2 mutation.  PLAN: Ms. Turley will need to be followed as high risk based on her diagnosis of Lynch syndrome.    Ms. Dung does not have a gastroenterologist to perform follow up for the diagnosis of Lynch syndrome. She is about 9-10 years away from needing a colonoscopy.  We will refer her back to her primary care provider.  Ms. Perno does not have a gynecologist to perform follow up for the diagnosis of Lynch syndrome.   We have asked that she go back to her referring physician to discuss hysterectomy at the appropriate time and referral options.  This note will be copied to Ms. Wayne's referring physician.  RESOURCES:  Ms. Shippee was given patient information about Lynch syndrome that was written by the national support group "it takes guts".   We strongly encouraged Ms. Teegarden to remain in contact with [place] in cancer genetics on an annual basis so we can update Ms. Tetro's personal and family histories, and inform her of advances in cancer genetics that may be of benefit for the entire family. Ms. Ripoll knows she is also welcome to call with any questions or concerns, at any time.   Grettel Rames P. Florene Glen, [place], [facility]  Licensed, Insurance risk surveyor [email or web address] phone: [phone]  The patient was seen for a total of 25 minutes in face-to-face genetic counseling.

## 2020-08-01 ENCOUNTER — Encounter: Payer: Self-pay | Admitting: Internal Medicine

## 2020-08-01 ENCOUNTER — Other Ambulatory Visit: Payer: Self-pay

## 2020-08-01 ENCOUNTER — Ambulatory Visit: Payer: 59 | Admitting: Internal Medicine

## 2020-08-01 VITALS — BP 114/64 | HR 96 | Temp 98.3°F | Resp 16 | Ht 64.8 in | Wt 120.0 lb

## 2020-08-01 DIAGNOSIS — R5382 Chronic fatigue, unspecified: Secondary | ICD-10-CM | POA: Insufficient documentation

## 2020-08-01 DIAGNOSIS — Z0000 Encounter for general adult medical examination without abnormal findings: Secondary | ICD-10-CM

## 2020-08-01 LAB — CBC WITH DIFFERENTIAL/PLATELET
Basophils Absolute: 0.1 10*3/uL (ref 0.0–0.1)
Basophils Relative: 0.8 % (ref 0.0–3.0)
Eosinophils Absolute: 0.3 10*3/uL (ref 0.0–0.7)
Eosinophils Relative: 3.3 % (ref 0.0–5.0)
HCT: 40.6 % (ref 36.0–46.0)
Hemoglobin: 13.3 g/dL (ref 12.0–15.0)
Lymphocytes Relative: 22.9 % (ref 12.0–46.0)
Lymphs Abs: 1.8 10*3/uL (ref 0.7–4.0)
MCHC: 32.8 g/dL (ref 30.0–36.0)
MCV: 85.1 fl (ref 78.0–100.0)
Monocytes Absolute: 0.5 10*3/uL (ref 0.1–1.0)
Monocytes Relative: 6.9 % (ref 3.0–12.0)
Neutro Abs: 5.2 10*3/uL (ref 1.4–7.7)
Neutrophils Relative %: 66.1 % (ref 43.0–77.0)
Platelets: 274 10*3/uL (ref 150.0–400.0)
RBC: 4.77 Mil/uL (ref 3.87–5.11)
RDW: 13.1 % (ref 11.5–15.5)
WBC: 7.8 10*3/uL (ref 4.0–10.5)

## 2020-08-01 LAB — HEPATIC FUNCTION PANEL
ALT: 15 U/L (ref 0–35)
AST: 20 U/L (ref 0–37)
Albumin: 4.6 g/dL (ref 3.5–5.2)
Alkaline Phosphatase: 55 U/L (ref 39–117)
Bilirubin, Direct: 0.1 mg/dL (ref 0.0–0.3)
Total Bilirubin: 0.7 mg/dL (ref 0.2–1.2)
Total Protein: 7.5 g/dL (ref 6.0–8.3)

## 2020-08-01 LAB — TSH: TSH: 1.81 u[IU]/mL (ref 0.35–4.50)

## 2020-08-01 NOTE — Progress Notes (Signed)
Subjective:  Patient ID: Alicia Li, female    DOB: 15-Dec-2000  Age: 20 y.o. MRN: [id number]  CC: Annual Exam  This visit occurred during the SARS-CoV-2 public health emergency.  Safety protocols were in place, including screening questions prior to the visit, additional usage of staff PPE, and extensive cleaning of exam room while observing appropriate contact time as indicated for disinfecting solutions.    HPI Alicia Li presents for a CPX.  Since I last saw her she has been seen at [facility] and tells me she is using a topical testosterone supplement.  Her fatigue has markedly improved.  She otherwise feels well and offers no other complaints.  Outpatient Medications Prior to Visit  Medication Sig Dispense Refill  . clindamycin (CLINDAGEL) 1 % gel     . naproxen (NAPROSYN) 500 MG tablet Take 1 tablet (500 mg total) by mouth 2 (two) times daily with a meal. 30 tablet 0  . pregabalin (LYRICA) 25 MG capsule Take 1 capsule (25 mg total) by mouth 3 (three) times daily. 90 capsule 0  . tretinoin (RETIN-A) 0.025 % gel      No facility-administered medications prior to visit.    ROS Review of Systems  Constitutional: Positive for fatigue. Negative for appetite change, diaphoresis and unexpected weight change.  HENT: Negative.   Eyes: Negative.   Respiratory: Negative for cough, chest tightness, shortness of breath and wheezing.   Cardiovascular: Negative for chest pain, palpitations and leg swelling.  Gastrointestinal: Negative for abdominal pain, constipation, diarrhea, nausea and vomiting.  Endocrine: Negative.   Genitourinary: Negative.   Musculoskeletal: Negative for arthralgias, joint swelling and myalgias.  Skin: Negative.  Negative for color change and pallor.  Neurological: Negative for dizziness and weakness.  Hematological: Negative for adenopathy. Does not bruise/bleed easily.  Psychiatric/Behavioral: Negative.     Objective:  BP 114/64 (BP Location:  Left Arm, Patient Position: Sitting, Cuff Size: Normal)   Pulse 96   Temp 98.3 F (36.8 C) (Oral)   Resp 16   Ht 5' 4.8" (1.646 m)   Wt 120 lb (54.4 kg)   LMP 08/01/2020   SpO2 99%   BMI 20.09 kg/m   BP Readings from Last 3 Encounters:  08/01/20 114/64  06/24/20 102/70  05/15/20 113/65    Wt Readings from Last 3 Encounters:  08/01/20 120 lb (54.4 kg)  06/24/20 117 lb 12.8 oz (53.4 kg)  05/23/19 117 lb 9.6 oz (53.3 kg)    Physical Exam Vitals reviewed.  HENT:     Nose: Nose normal.     Mouth/Throat:     Mouth: Mucous membranes are moist.  Eyes:     General: No scleral icterus.    Conjunctiva/sclera: Conjunctivae normal.  Cardiovascular:     Rate and Rhythm: Normal rate and regular rhythm.     Heart sounds: No murmur heard.   Pulmonary:     Effort: Pulmonary effort is normal.     Breath sounds: No stridor. No wheezing, rhonchi or rales.  Abdominal:     General: Abdomen is flat.     Palpations: There is no mass.     Tenderness: There is no abdominal tenderness. There is no guarding.  Musculoskeletal:        General: Normal range of motion.     Cervical back: Neck supple.     Right lower leg: No edema.     Left lower leg: No edema.  Lymphadenopathy:     Cervical: No cervical adenopathy.  Skin:  General: Skin is warm and dry.  Neurological:     General: No focal deficit present.     Mental Status: She is alert.  Psychiatric:        Mood and Affect: Mood normal.        Behavior: Behavior normal.     Lab Results  Component Value Date   WBC 7.8 08/01/2020   HGB 13.3 08/01/2020   HCT 40.6 08/01/2020   PLT 274.0 08/01/2020   GLUCOSE 59 (L) 07/13/2017   ALT 15 08/01/2020   AST 20 08/01/2020   NA 139 07/13/2017   K 3.6 07/13/2017   CL 103 07/13/2017   CREATININE 0.85 07/13/2017   BUN 16 07/13/2017   CO2 28 07/13/2017   TSH 1.81 08/01/2020    No results found.  Assessment & Plan:   Alicia Li was seen today for annual exam.  Diagnoses and all  orders for this visit:  Chronic fatigue- Labs are reassuring.  Improvement noted with testosterone replacement therapy. -     CBC with Differential/Platelet; Future -     TSH; Future -     Hepatic function panel; Future -     CBC with Differential/Platelet -     TSH -     Hepatic function panel  Routine general medical examination at a health care facility- Exam completed, labs reviewed, vaccines are up-to-date, no cancer screenings are indicated. -     HIV Antibody (routine testing w rflx); Future -     HIV Antibody (routine testing w rflx)   I am having Alicia Li maintain her tretinoin, clindamycin, naproxen, and pregabalin.  No orders of the defined types were placed in this encounter.    Follow-up: Return in about 6 months (around 02/01/2021).  [name], MD

## 2020-08-01 NOTE — Patient Instructions (Signed)

## 2020-08-02 LAB — HIV ANTIBODY (ROUTINE TESTING W REFLEX): HIV 1&2 Ab, 4th Generation: NONREACTIVE
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
## 2021-02-19 ENCOUNTER — Ambulatory Visit: Payer: 59 | Admitting: Internal Medicine

## 2021-02-20 ENCOUNTER — Ambulatory Visit: Payer: 59 | Admitting: Internal Medicine

## 2021-02-27 ENCOUNTER — Encounter: Payer: Self-pay | Admitting: Internal Medicine

## 2021-02-28 ENCOUNTER — Other Ambulatory Visit: Payer: Self-pay | Admitting: Internal Medicine

## 2021-02-28 DIAGNOSIS — U071 COVID-19: Secondary | ICD-10-CM | POA: Insufficient documentation

## 2021-02-28 DIAGNOSIS — J208 Acute bronchitis due to other specified organisms: Secondary | ICD-10-CM | POA: Insufficient documentation

## 2021-02-28 MED ORDER — HYDROCODONE BIT-HOMATROP MBR 5-1.5 MG/5ML PO SOLN: 5.0000 mL | Freq: Three times a day (TID) | ORAL | 0 refills | Status: AC | PRN

## 2021-03-04 ENCOUNTER — Ambulatory Visit: Payer: 59 | Admitting: Internal Medicine

## 2021-03-25 ENCOUNTER — Ambulatory Visit: Payer: 59 | Admitting: Internal Medicine

## 2021-04-09 ENCOUNTER — Encounter: Payer: Self-pay | Admitting: Internal Medicine

## 2021-04-14 ENCOUNTER — Other Ambulatory Visit: Payer: Self-pay

## 2021-04-14 ENCOUNTER — Ambulatory Visit: Payer: 59 | Admitting: Internal Medicine

## 2021-04-14 ENCOUNTER — Encounter: Payer: Self-pay | Admitting: Internal Medicine

## 2021-04-14 VITALS — BP 122/78 | HR 75 | Temp 98.3°F | Resp 16 | Ht 64.75 in | Wt 120.0 lb

## 2021-04-14 DIAGNOSIS — L280 Lichen simplex chronicus: Secondary | ICD-10-CM

## 2021-04-14 MED ORDER — PREGABALIN 25 MG PO CAPS: 25.0000 mg | ORAL_CAPSULE | Freq: Three times a day (TID) | ORAL | 1 refills | Status: DC

## 2021-04-14 NOTE — Progress Notes (Signed)
Subjective:  Patient ID: Alicia Li, female    DOB: 2001-01-18  Age: 21 y.o. MRN: [id number]  CC: No chief complaint on file.  This visit occurred during the SARS-CoV-2 public health emergency.  Safety protocols were in place, including screening questions prior to the visit, additional usage of staff PPE, and extensive cleaning of exam room while observing appropriate contact time as indicated for disinfecting solutions.    HPI Alicia Li presents for f/up -   I treated her years ago for neuropathy with pregabalin.  She is no longer taking it.  She complains that over the last few months her symptoms have returned.  She complains of a sharp pain mostly in her toes but somewhat in her hands.  She describes an itchy sensation.  She tells me her symptoms are exacerbated by fatigue and dry skin.  She denies numbness, weakness, or tingling.  Outpatient Medications Prior to Visit  Medication Sig Dispense Refill   clindamycin (CLINDAGEL) 1 % gel      testosterone cypionate (DEPOTESTOSTERONE CYPIONATE) 200 MG/ML injection SMARTSIG:0.4 Milliliter(s) SUB-Q Once a Week     tretinoin (RETIN-A) 0.025 % gel      pregabalin (LYRICA) 25 MG capsule Take 1 capsule (25 mg total) by mouth 3 (three) times daily. 90 capsule 0   No facility-administered medications prior to visit.    ROS Review of Systems  Constitutional:  Negative for diaphoresis and fatigue.  HENT: Negative.    Eyes: Negative.   Respiratory:  Negative for cough, chest tightness, shortness of breath and wheezing.   Cardiovascular:  Negative for chest pain, palpitations and leg swelling.  Gastrointestinal:  Negative for abdominal pain and diarrhea.  Endocrine: Negative.   Genitourinary: Negative.  Negative for difficulty urinating.  Musculoskeletal: Negative.  Negative for arthralgias and myalgias.  Skin: Negative.   Neurological:  Negative for dizziness, facial asymmetry, weakness, light-headedness, numbness and headaches.   Hematological:  Negative for adenopathy. Does not bruise/bleed easily.  Psychiatric/Behavioral: Negative.     Objective:  BP 122/78 (BP Location: Right Arm, Patient Position: Sitting, Cuff Size: Large)    Pulse 75    Temp 98.3 F (36.8 C) (Oral)    Resp 16    Ht 5' 4.75" (1.645 m)    Wt 120 lb (54.4 kg)    SpO2 97%    BMI 20.12 kg/m   BP Readings from Last 3 Encounters:  04/14/21 122/78  08/01/20 114/64  06/24/20 102/70    Wt Readings from Last 3 Encounters:  04/14/21 120 lb (54.4 kg)  08/01/20 120 lb (54.4 kg)  06/24/20 117 lb 12.8 oz (53.4 kg)    Physical Exam Vitals reviewed.  Constitutional:      Appearance: Normal appearance.  HENT:     Mouth/Throat:     Mouth: Mucous membranes are moist.  Eyes:     General: No scleral icterus.    Conjunctiva/sclera: Conjunctivae normal.  Cardiovascular:     Rate and Rhythm: Normal rate and regular rhythm.     Heart sounds: No murmur heard. Pulmonary:     Effort: Pulmonary effort is normal.     Breath sounds: No stridor. No wheezing, rhonchi or rales.  Abdominal:     General: Abdomen is flat.     Palpations: There is no mass.     Tenderness: There is no abdominal tenderness. There is no guarding.     Hernia: No hernia is present.  Musculoskeletal:        General: Normal range  of motion.     Cervical back: Neck supple.     Right lower leg: No edema.     Left lower leg: No edema.  Lymphadenopathy:     Cervical: No cervical adenopathy.  Skin:    General: Skin is warm and dry.     Findings: No rash.  Neurological:     Mental Status: She is alert.     Cranial Nerves: Cranial nerves 2-12 are intact.     Sensory: Sensation is intact. No sensory deficit.     Motor: No weakness or atrophy.     Coordination: Coordination normal.     Deep Tendon Reflexes: Reflexes normal.     Reflex Scores:      Tricep reflexes are 1+ on the right side and 1+ on the left side.      Bicep reflexes are 1+ on the right side and 1+ on the left  side.      Brachioradialis reflexes are 1+ on the right side and 1+ on the left side.      Patellar reflexes are 1+ on the right side and 1+ on the left side.      Achilles reflexes are 1+ on the right side and 1+ on the left side.   Lab Results  Component Value Date   WBC 7.8 08/01/2020   HGB 13.3 08/01/2020   HCT 40.6 08/01/2020   PLT 274.0 08/01/2020   GLUCOSE 59 (L) 07/13/2017   ALT 15 08/01/2020   AST 20 08/01/2020   NA 139 07/13/2017   K 3.6 07/13/2017   CL 103 07/13/2017   CREATININE 0.85 07/13/2017   BUN 16 07/13/2017   CO2 28 07/13/2017   TSH 1.81 08/01/2020    No results found.  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 Will increase the dose if needed. -     pregabalin (LYRICA) 25 MG capsule; Take 1 capsule (25 mg total) by mouth 3 (three) times daily.   I am having [name] maintain her tretinoin, clindamycin, testosterone cypionate, and pregabalin.  Meds ordered this encounter  Medications   pregabalin (LYRICA) 25 MG capsule    Sig: Take 1 capsule (25 mg total) by mouth 3 (three) times daily.    Dispense:  270 capsule    Refill:  1     Follow-up: No follow-ups on file.  Alicia Calico, MD

## 2021-04-14 NOTE — Patient Instructions (Signed)
Neurodermatitis Neurodermatitis is an inflammation and thickening of the skin. It is caused by severe itchiness, which leads to repeated scratching and rubbing of the skin. It can happen anywhere on the body. Common places include the neck, head, arms, and legs. Neurodermatitis may have mental or emotional (psychogenic) causes that may need treatment. Usually, this is a lifelong (chronic) condition, but in some cases it may go away on its own. What are the causes? This condition is caused by repeated scratching and rubbing of the skin. This happens because of feelings of severe itchiness. Often, the cause of itchiness is not known. Common causes include: Materials or substances that irritate the skin. Skin conditions, such as chronic dermatitis or eczema. Allergic reaction. In some cases, neurodermatitis may have psychogenic causes, like anxiety or stress, that may need to be treated. What increases the risk? You are more likely to develop this condition if: You have dry skin or other skin conditions. You have an anxiety disorder or a lot of stress. You are 49-63 years old. You are a woman. You are exposed to irritating chemicals. What are the signs or symptoms? The main symptom of this condition is one or more patches of skin that are red, swollen, itchy, and thicker than normal. These patches can be anywhere on the body, but are often on the head, neck, legs, and arms. This condition can also affect the genital and anal areas. In severe cases, bleeding, crusting, and scaling of the skin can occur. Symptoms often come and go over time. The frequency and severity of symptoms varies from person to person. How is this diagnosed? This condition is diagnosed based on your medical history and a physical exam of your skin. You may be referred to a health care provider who specializes in skin care (dermatologist). You may also have tests, including: Skin allergy tests. These tests will determine if  you have an allergy that is causing your condition. Blood or other lab tests. These tests can help to determine if your itching is caused by an infection or other condition. In some cases, your health care provider may remove a small amount of skin cells to be examined under a microscope (biopsy). How is this treated? This condition is managed by stopping all scratching and rubbing of the affected area. It is also important to remove all skin irritants and treat any underlying causes of neurodermatitis. Depending on the cause, your health care provider may recommend certain treatments such as: Creams, lotions, or pills to reduce inflammation and itching (corticosteroids). Medicines to prevent or treat infection (antibiotics). Medicines to relieve allergy symptoms (antihistamines). Therapy to learn how to stop feelings of itchiness and stop scratching (behavioral therapy or psychotherapy). Your health care provider may recommend a doctor who specializes in human behavior (psychologist). Phototherapy. This condition sometimes goes away without treatment. Follow these instructions at home: Skin care  Avoid scratching and rubbing your skin. This is the best way to manage neurodermatitis and prevent it from returning. Keep your skin clean and moisturized. Avoid very hot water. Apply lotion at least one time per day. Avoid products, such as soaps and lotions, that have harsh chemicals, scents, and dyes. Try to shower and take baths only as often as you need to. Frequent bathing can dry out your skin. Avoid tight or rough clothing that irritates your skin. Apply cool washcloths to your skin to help reduce itching. General instructions Use over-the-counter and prescription medicines only as told by your health care provider. Drink  enough fluid to keep your urine pale yellow. Avoid irritating chemicals. Pay attention to your symptoms. Watch for things that trigger itching and scratching. Keep your  fingernails cut short to reduce injury from scratching. Keep all follow-up visits as told by your health care provider. This is important. Contact a health care provider if: Your condition gets worse or does not get better after 3-4 days of treatment. You have blood or fluid leaking from an irritated patch of skin. You have a fever. Summary Neurodermatitis is an inflammation and thickening of the skin. It is caused by severe itchiness, which leads to repeated scratching and rubbing of the skin. Depending on the cause, your health care provider may recommend certain treatments such as creams, lotions, and pills to reduce inflammation and other medicines to treat infection, if needed. Treatment may also include therapy to learn how to stop feelings of itchiness and stop scratching (behavioral therapy or psychotherapy). Use over-the-counter and prescription medicines only as told by your health care provider. Keep all follow-up visits as told by your health care provider. This is important. This information is not intended to replace advice given to you by your health care provider. Make sure you discuss any questions you have with your health care provider. Document Revised: 07/01/2018 Document Reviewed: 07/28/2017 Elsevier Patient Education  [place].

## 2021-05-06 ENCOUNTER — Encounter: Payer: Self-pay | Admitting: Internal Medicine

## 2021-05-12 NOTE — Progress Notes (Deleted)
Subjective:    Patient ID: Alicia Li, female    DOB: April 27, 2000, 21 y.o.   MRN: [id number]  This visit occurred during the SARS-CoV-2 public health emergency.  Safety protocols were in place, including screening questions prior to the visit, additional usage of staff PPE, and extensive cleaning of exam room while observing appropriate contact time as indicated for disinfecting solutions.    HPI The patient is here for an acute visit.   Eczema -   Vaseline Low potency corticosteroid bid x 14 days max  - desonide cream 0.05   Medications and allergies reviewed with patient and updated if appropriate.  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 270 capsule 1   testosterone cypionate (DEPOTESTOSTERONE CYPIONATE) 200 MG/ML injection SMARTSIG:0.4 Milliliter(s) SUB-Q Once a Week     tretinoin (RETIN-A) 0.025 % gel      No current facility-administered medications on file prior to visit.    Past Medical History:  Diagnosis Date   Family history of colon cancer    Family history of Lynch syndrome     No past surgical history on file.  Social History   Socioeconomic History   Marital status: Single    Spouse name: Not on file   Number of children: Not on file   Years of education: Not on file   Highest education level: Not on file  Occupational History   Not on file   Tobacco Use   Smoking status: Never   Smokeless tobacco: Never   Tobacco comments:    patient admitted to smoking 1 black and white cigar/day  Vaping Use   Vaping Use: Never used  Substance and Sexual Activity   Alcohol use: Yes   Drug use: Yes    Types: Marijuana   Sexual activity: Not Currently    Partners: Female    Birth control/protection: Abstinence  Other Topics Concern   Not on file  Social History Narrative   Not on file   Social Determinants of Health   Financial Resource Strain: Not on file  Food Insecurity: Not on file  Transportation Needs: Not on file  Physical Activity: Not on file  Stress: Not on file  Social Connections: Not on file    Family History  Problem Relation Age of Onset   Colon polyps Mother    Other Mother        Lynch syndrome - PMS2 pos   Colon cancer Maternal Grandfather 82   Heart disease Paternal Grandfather    Other Maternal Aunt        PMS2 neg   Colon cancer Other 66       MGFs brother   Colon cancer Other 30       MGF's brother   Colon cancer Other 65       MGFs sister - MLH1 pos    Review of Systems  Objective:  There were no vitals filed for this visit. BP Readings from Last 3 Encounters:  04/14/21 122/78  08/01/20 114/64  06/24/20 102/70   Wt Readings from Last 3 Encounters:  04/14/21 120 lb (54.4 kg)  08/01/20 120 lb (54.4 kg)  06/24/20 117 lb 12.8 oz (53.4 kg)   There is no height or weight on file to calculate BMI.   Physical Exam         Assessment & Plan:    See Problem List for Assessment and Plan of chronic medical problems.

## 2021-05-13 ENCOUNTER — Ambulatory Visit: Payer: 59 | Admitting: Internal Medicine

## 2021-07-10 ENCOUNTER — Ambulatory Visit: Payer: 59 | Admitting: Family Medicine

## 2021-07-17 ENCOUNTER — Ambulatory Visit: Payer: 59 | Admitting: Internal Medicine

## 2021-07-27 ENCOUNTER — Encounter: Payer: Self-pay | Admitting: Internal Medicine

## 2021-07-29 ENCOUNTER — Ambulatory Visit: Payer: 59 | Admitting: Internal Medicine

## 2021-11-10 ENCOUNTER — Ambulatory Visit: Payer: 59 | Admitting: Internal Medicine

## 2021-11-20 ENCOUNTER — Encounter: Payer: Self-pay | Admitting: Internal Medicine

## 2021-11-20 NOTE — Progress Notes (Deleted)
    Subjective:    Patient ID: Alicia Li, female    DOB: 10/07/2000, 21 y.o.   MRN: [id number]      HPI Alicia Li is here for No chief complaint on file.    Left hand pain -     Medications and allergies reviewed with patient and updated if appropriate.  Current Outpatient Medications on File Prior to Visit  Medication Sig Dispense Refill   clindamycin (CLINDAGEL) 1 % gel      pregabalin (LYRICA) 25 MG capsule Take 1 capsule (25 mg total) by mouth 3 (three) times daily. 270 capsule 1   testosterone cypionate (DEPOTESTOSTERONE CYPIONATE) 200 MG/ML injection SMARTSIG:0.4 Milliliter(s) SUB-Q Once a Week     tretinoin (RETIN-A) 0.025 % gel      No current facility-administered medications on file prior to visit.    Review of Systems     Objective:  There were no vitals filed for this visit. BP Readings from Last 3 Encounters:  04/14/21 122/78  08/01/20 114/64  06/24/20 102/70   Wt Readings from Last 3 Encounters:  04/14/21 120 lb (54.4 kg)  08/01/20 120 lb (54.4 kg)  06/24/20 117 lb 12.8 oz (53.4 kg)   There is no height or weight on file to calculate BMI.    Physical Exam         Assessment & Plan:    See Problem List for Assessment and Plan of chronic medical problems.

## 2021-11-21 ENCOUNTER — Ambulatory Visit: Payer: 59 | Admitting: Internal Medicine

## 2021-12-15 ENCOUNTER — Ambulatory Visit: Payer: 59 | Admitting: Internal Medicine

## 2022-01-11 ENCOUNTER — Encounter (HOSPITAL_BASED_OUTPATIENT_CLINIC_OR_DEPARTMENT_OTHER): Payer: Self-pay | Admitting: Emergency Medicine

## 2022-01-11 ENCOUNTER — Emergency Department (HOSPITAL_BASED_OUTPATIENT_CLINIC_OR_DEPARTMENT_OTHER)
Admission: EM | Admit: 2022-01-11 | Discharge: 2022-01-11 | Disposition: A | Discharge status: Home / Self Care | Payer: 59 | Attending: Emergency Medicine | Admitting: Emergency Medicine

## 2022-01-11 ENCOUNTER — Other Ambulatory Visit: Payer: Self-pay

## 2022-01-11 DIAGNOSIS — Z8481 Family history of carrier of genetic disease: Secondary | ICD-10-CM | POA: Insufficient documentation

## 2022-01-11 DIAGNOSIS — D72829 Elevated white blood cell count, unspecified: Secondary | ICD-10-CM | POA: Diagnosis not present

## 2022-01-11 DIAGNOSIS — E876 Hypokalemia: Secondary | ICD-10-CM | POA: Diagnosis not present

## 2022-01-11 DIAGNOSIS — K921 Melena: Secondary | ICD-10-CM | POA: Insufficient documentation

## 2022-01-11 DIAGNOSIS — Z800 Family history of malignant neoplasm of digestive organs: Secondary | ICD-10-CM

## 2022-01-11 LAB — CBC
HCT: 43.6 % (ref 36.0–46.0)
Hemoglobin: 14 g/dL (ref 12.0–15.0)
MCH: 28.1 pg (ref 26.0–34.0)
MCHC: 32.1 g/dL (ref 30.0–36.0)
MCV: 87.4 fL (ref 80.0–100.0)
Platelets: 250 10*3/uL (ref 150–400)
RBC: 4.99 MIL/uL (ref 3.87–5.11)
RDW: 13.2 % (ref 11.5–15.5)
WBC: 12.5 10*3/uL — ABNORMAL HIGH (ref 4.0–10.5)
nRBC: 0 % (ref 0.0–0.2)

## 2022-01-11 LAB — COMPREHENSIVE METABOLIC PANEL
ALT: 9 U/L (ref 0–44)
AST: 17 U/L (ref 15–41)
Albumin: 4.9 g/dL (ref 3.5–5.0)
Alkaline Phosphatase: 67 U/L (ref 38–126)
Anion gap: 12 (ref 5–15)
BUN: 10 mg/dL (ref 6–20)
CO2: 25 mmol/L (ref 22–32)
Calcium: 9.6 mg/dL (ref 8.9–10.3)
Chloride: 104 mmol/L (ref 98–111)
Creatinine, Ser: 0.82 mg/dL (ref 0.44–1.00)
GFR, Estimated: >60 mL/min (ref >60)
Glucose, Bld: 119 mg/dL — ABNORMAL HIGH (ref 70–99)
Potassium: 3.4 mmol/L — ABNORMAL LOW (ref 3.5–5.1)
Sodium: 141 mmol/L (ref 135–145)
Total Bilirubin: 0.8 mg/dL (ref 0.3–1.2)
Total Protein: 7 g/dL (ref 6.5–8.1)

## 2022-01-11 LAB — OCCULT BLOOD X 1 CARD TO LAB, STOOL: Fecal Occult Bld: NEGATIVE

## 2022-01-11 NOTE — Discharge Instructions (Addendum)
Your overall work-up today was reassuring.  Stool today did not have any evidence of blood.  Labs were overall reassuring, I do not see evidence of anemia today.  I strongly recommend following up with a gastroenterologist for further evaluation.  You have been given the contact information for a local gastroenterology office by the name of [facility] gastro[facility].  Please call to schedule a follow-up appointment within the next 3 to 5 days for reevaluation and continued medical management.  Please return to the ED for any new or worsening symptoms.

## 2022-01-11 NOTE — ED Triage Notes (Signed)
Pt via pov from home with rectal bleeding this morning. She reports that her stool had bright red blood in it. Denies any pain or discomfort; states she does strain with bowel movements. Pt denies any hx of the same. Pt alert & oriented, nad noted.

## 2022-01-11 NOTE — ED Provider Notes (Signed)
 MEDCENTER [facility] EMERGENCY DEPT Provider Note   CSN: [id number] Arrival date & time: 01/11/22  0908     History  Chief Complaint  Patient presents with   Rectal Bleeding    Alicia Li is a 21 y.o. female with family history of Lynch syndrome and colon cancer presenting today with new onset of rectal bleeding this morning.  States she had nonspicy [employer] and pink lemonade for dinner last night, had 1-2 episodes of mild diarrhea this morning, followed by a red/bloody bowel movement.  Also noticed 1-2 spots of bleeding on the toilet paper.  Did not notice any significant pain or difficulty passing the stool.  No known Hx of hemorrhoids.  Also reports feeling increased stress, fatigue, decreased appetite over the last 2 to 3 weeks.  Is concerned due to family history of Lynch syndrome, reports is due to have a colonoscopy in the near future.  Denies fever, nausea, vomiting, urinary symptoms, or abdominal pain.  The history is provided by the patient and medical records.  Rectal Bleeding    Home Medications Prior to Admission medications   Medication Sig Start Date End Date Taking? Authorizing Provider  clindamycin (CLINDAGEL) 1 % gel  06/02/17   [provider]  pregabalin  (LYRICA ) 25 MG capsule Take 1 capsule (25 mg total) by mouth 3 (three) times daily. 04/14/21   Joshua Debby CROME, MD  testosterone  cypionate (DEPOTESTOSTERONE CYPIONATE) 200 MG/ML injection SMARTSIG:0.4 Milliliter(s) SUB-Q Once a Week 03/27/21   [provider]  tretinoin (RETIN-A) 0.025 % gel  06/02/17   [provider]      Allergies    Patient has no known allergies.    Review of Systems   Review of Systems  Constitutional:  Positive for fatigue.  Gastrointestinal:  Positive for anal bleeding, blood in stool, diarrhea and hematochezia.    Physical Exam Updated Vital Signs BP (!) 146/79 (BP Location: Right Arm)   Pulse (!) 101   Temp 98.9 F (37.2 C) (Oral)   Resp 18    Ht 5' 4 (1.626 m)   Wt 53.5 kg   LMP 10/21/2021 (Approximate) Comment: transgender  SpO2 98%   BMI 20.25 kg/m  Physical Exam Vitals and nursing note reviewed. Exam conducted with a chaperone present.  Constitutional:      General: She is not in acute distress.    Appearance: Normal appearance. She is well-developed. She is not ill-appearing, toxic-appearing or diaphoretic.  HENT:     Head: Normocephalic and atraumatic.     Mouth/Throat:     Pharynx: Oropharynx is clear.  Eyes:     General: No scleral icterus.    Conjunctiva/sclera: Conjunctivae normal.  Neck:     Comments: Neck very supple on exam, no meningismus Cardiovascular:     Rate and Rhythm: Normal rate and regular rhythm.     Pulses: Normal pulses.     Heart sounds: No murmur heard. Pulmonary:     Effort: Pulmonary effort is normal. No respiratory distress.     Breath sounds: Normal breath sounds.  Chest:     Chest wall: No tenderness.  Abdominal:     General: There is no distension.     Palpations: Abdomen is soft.     Tenderness: There is no abdominal tenderness. There is no right CVA tenderness, left CVA tenderness or guarding.  Genitourinary:    Exam position: Knee-chest position.     Comments: Anal tone normal.  No evidence of external hemorrhoids.  Rectum nontender.  No anal fissure or mass appreciated. Musculoskeletal:        General: No swelling.     Cervical back: Neck supple. No rigidity.  Skin:    General: Skin is warm and dry.     Capillary Refill: Capillary refill takes less than 2 seconds.     Coloration: Skin is not jaundiced or pale.  Neurological:     Mental Status: She is alert and oriented to person, place, and time.  Psychiatric:        Mood and Affect: Mood normal.     ED Results / Procedures / Treatments   Labs (all labs ordered are listed, but only abnormal results are displayed) Labs Reviewed  COMPREHENSIVE METABOLIC PANEL - Abnormal; Notable for the following components:       Result Value   Potassium 3.4 (*)    Glucose, Bld 119 (*)    All other components within normal limits  CBC - Abnormal; Notable for the following components:   WBC 12.5 (*)    All other components within normal limits  OCCULT BLOOD X 1 CARD TO LAB, STOOL    EKG None  Radiology No results found.  Procedures Procedures    Medications Ordered in ED Medications - No data to display  ED Course/ Medical Decision Making/ A&P                           Medical Decision Making Amount and/or Complexity of Data Reviewed Labs: ordered.   21 y.o. female presents to the ED for concern of Rectal Bleeding   This involves an extensive number of treatment options, and is a complaint that carries with it a high risk of complications and morbidity.  The emergent differential diagnosis prior to evaluation includes, but is not limited to: Hemorrhoid, anal fissure, diverticulosis  This is not an exhaustive differential.   Past Medical History / Co-morbidities / Social History: Family history of Lynch syndrome and colon cancer Social Determinants of Health include: None  Additional History:  None  Lab Tests: I ordered, and personally interpreted labs.  The pertinent results include:   Potassium 3.4, very mild Glucose 119 nonfasting WBC 12.5, very mildly elevated, nonspecific FOBT negative  Imaging Studies: None  ED Course: Pt well-appearing on exam.  Nontoxic, nonseptic appearing in NAD.  Sitting comfortably, afebrile, AAOx4.  Presenting today with 1 episode of bloody bowel movement, described as bright red blood with 1-2 spots noticed on the bath room tissue as well.  Ate [employer] with pink lemonade the night before for dinner.  No prior history of abdominal surgeries, abdominal tenderness, N/V/D or constipation.  Patient reports being due to have a colonoscopy soon due to family history of Lynch syndrome. Abdomen soft, nontender, does not appear surgical at this time.  Does not  meet SIRS or sepsis criteria.  Low suspicion for diverticulosis, diverticulitis, bowel perforation, bowel obstruction.  No external hemorrhoids, anal fissure, mass, or tenderness appreciated.  No active bleeding from the rectum or evidence of rectal bleeding since arrival to the ED.  Has had 1 bowel movement in ED and again without any evidence of blood.  FOBT negative.  No definitive internal hemorrhoids appreciated as well.  No evidence of anemia.  Noticeable increased stressors over the last 2 to 3 weeks, may be contributing to decreased appetite and changes in energy level.  Still recommend close follow-up with GI, especially considering history of familial Lynch syndrome.  Patient in NAD and in good condition at time of discharge.  Disposition: After consideration the patient's encounter today, I do not feel today's workup suggests an emergent condition requiring admission or immediate intervention beyond what has been performed at this time.  Safe for discharge; instructed to return immediately for worsening symptoms, change in symptoms or any other concerns.  I have reviewed the patients home medicines and have made adjustments as needed.  Discussed course of treatment with the patient, whom demonstrated understanding.  Patient in agreement and has no further questions.     This chart was dictated using voice recognition software.  Despite best efforts to proofread, errors can occur which can change the documentation meaning.         Final Clinical Impression(s) / ED Diagnoses Final diagnoses:  Blood in stool  Family history of Lynch syndrome    Rx / DC Orders ED Discharge Orders     None         Aella Ronda M, PA-C 01/14/22 2304    Patsey Lot, MD 01/15/22 [phone]

## 2022-01-12 NOTE — Progress Notes (Signed)
Referring Provider: No ref. provider found Primary Care Physician:  Janith Lima, MD  Reason for Consultation: Blood in the stool   IMPRESSION:  Change in bowel habits with recent blood in the stool Hypokalemia PMS2-related Lynch Syndrome  Colonoscopy recommended to evaluate the symptoms given her high risk for polyps and cancer.    PLAN: - BMP to follow-up on hypokalemia - Colonoscopy now to evaluate symptoms, then every 1-2 years - Start upper endoscopy at age 65   HPI: Alicia Li is a 21 y.o. female with a family history of Lynch syndrome who presents for evaluation of rectal bleeding after being seen in the ED for similar symptoms 01/11/2022. Her mother and grandmother accompany her to this appointment.   Recent ED visit for change in bowel habits and blood in the stool. States she had spicy Cheetos in the afternoon and nonspicy [employer] and pink lemonade for dinner 04/12/21, had 1-2 episodes of mild diarrhea that morning, followed by a red/bloody bowel movement.  Also noticed 1-2 spots of bleeding on the toilet paper.  Did not notice any significant pain or difficulty passing the stool.  No known Hx of hemorrhoids.  Also reports feeling increased stress, fatigue, decreased appetite over the last 2 to 3 weeks.  Is concerned due to family history of Lynch syndrome.  Sense of incomplete evacuation for the last 1-2 weeks. Frequent straining. She has lost over 12 pounds in 3 months.   Normal CMP except for potassium of 3.4 and a glucose of 119.  Normal liver enzymes.  Normal CBC except for white count of 12.5.  Fecal occult blood test negative.  No recent abdominal imaging.  No prior endoscopic evaluation.   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   The patient's mother has a diagnosis of Lynch syndrome and is being screened appropriately.  She has five paternal half siblings - three sisters and two brothers.  One sister has undergone testing and was  PMS2 negative. The maternal grandparents are alive.  The grandfather was diagnosed with colon cancer at 92.  He has three sisters and two brothers.  Both brothers and one sister had colon cancer, diagnosed between ages 35-51. Reportedly, one of the grandfather's siblings tested positive for an MLH1 mutation.  The maternal grandmother is alive and cancer free.   Past Medical History:  Diagnosis Date   Family history of colon cancer    Family history of Lynch syndrome    Lynch syndrome     Past Surgical History:  Procedure Laterality Date   TONSILLECTOMY      Current Outpatient Medications  Medication Sig Dispense Refill   clindamycin (CLINDAGEL) 1 % gel      ondansetron (ZOFRAN) 4 MG tablet Take 1 tablet 30 minutes before drinking colonoscopy prep 2 tablet 0   pregabalin (LYRICA) 25 MG capsule Take 1 capsule (25 mg total) by mouth 3 (three) times daily. 270 capsule 1   testosterone cypionate (DEPOTESTOSTERONE CYPIONATE) 200 MG/ML injection SMARTSIG:0.4 Milliliter(s) SUB-Q Once a Week     tretinoin (RETIN-A) 0.025 % gel      No current facility-administered medications for this visit.    Allergies as of 01/13/2022   (No Known Allergies)    Family History  Problem Relation Age of Onset   Colon polyps Mother    Other Mother        Lynch syndrome - PMS2 pos   Diabetes Father    Other Maternal Grandmother  Lynch syndrome   Colon cancer Maternal Grandfather 55   Heart disease Paternal Grandfather    Diabetes Paternal Grandfather    Other Maternal Aunt        PMS2 neg   Colon cancer Other 74       MGFs brother   Colon cancer Other 64       MGF's brother   Colon cancer Other 36       MGFs sister - MLH1 pos   Cervical cancer Paternal Great-grandmother     Review of Systems: 12 system ROS is negative except as noted above with the addition of vision changes, fatigue, and headaches.   Physical Exam: General:   Alert,  well-nourished, pleasant and cooperative in  NAD Head:  Normocephalic and atraumatic. Eyes:  Sclera clear, no icterus.   Conjunctiva pink. Ears:  Normal auditory acuity. Nose:  No deformity, discharge,  or lesions. Mouth:  No deformity or lesions.   Neck:  Supple; no masses or thyromegaly. Lungs:  Clear throughout to auscultation.   No wheezes. Heart:  Regular rate and rhythm; no murmurs. Abdomen:  Soft, nontender, nondistended, normal bowel sounds, no rebound or guarding. No hepatosplenomegaly.   Rectal:  Deferred  Msk:  Symmetrical. No boney deformities LAD: No inguinal or umbilical LAD Extremities:  No clubbing or edema. Neurologic:  Alert and  oriented x4;  grossly nonfocal Skin:  Intact without significant lesions or rashes. Psych:  Alert and cooperative. Normal mood and affect.   Orissa Arreaga L. Tarri Glenn, MD, MPH 01/18/2022, 8:07 PM

## 2022-01-13 ENCOUNTER — Encounter: Payer: Self-pay | Admitting: Gastroenterology

## 2022-01-13 ENCOUNTER — Telehealth: Payer: Self-pay

## 2022-01-13 ENCOUNTER — Other Ambulatory Visit (INDEPENDENT_AMBULATORY_CARE_PROVIDER_SITE_OTHER): Payer: 59

## 2022-01-13 ENCOUNTER — Ambulatory Visit: Payer: 59 | Admitting: Gastroenterology

## 2022-01-13 VITALS — BP 110/70 | HR 92 | Ht 65.0 in | Wt 118.1 lb

## 2022-01-13 DIAGNOSIS — R194 Change in bowel habit: Secondary | ICD-10-CM | POA: Diagnosis not present

## 2022-01-13 DIAGNOSIS — Z1509 Genetic susceptibility to other malignant neoplasm: Secondary | ICD-10-CM

## 2022-01-13 LAB — BASIC METABOLIC PANEL
BUN: 16 mg/dL (ref 6–23)
CO2: 27 mEq/L (ref 19–32)
Calcium: 10 mg/dL (ref 8.4–10.5)
Chloride: 102 mEq/L (ref 96–112)
Creatinine, Ser: 0.93 mg/dL (ref 0.40–1.20)
GFR: 86.86 mL/min (ref >60.00)
Glucose, Bld: 79 mg/dL (ref 70–99)
Potassium: 4.3 mEq/L (ref 3.5–5.1)
Sodium: 139 mEq/L (ref 135–145)

## 2022-01-13 MED ORDER — NA SULFATE-K SULFATE-MG SULF 17.5-3.13-1.6 GM/177ML PO SOLN: 1.0000 | Freq: Once | ORAL | 0 refills | Status: AC

## 2022-01-13 MED ORDER — ONDANSETRON HCL 4 MG PO TABS: ORAL_TABLET | ORAL | 0 refills | Status: DC

## 2022-01-13 NOTE — Telephone Encounter (Signed)
Spoke with patient about recent visit to ED. Patient has a follow up appointment with GI today and will follow up with [place] afterwards as needed.

## 2022-01-13 NOTE — Patient Instructions (Addendum)
You have been scheduled for a colonoscopy. Please follow written instructions given to you at your visit today.  Please pick up your prep supplies at the pharmacy within the next 1-3 days. If you use inhalers (even only as needed), please bring them with you on the day of your procedure.  _______________________________________________________  If you are age 23 or older, your body mass index should be between 23-30. Your Body mass index is 19.66 kg/m. If this is out of the aforementioned range listed, please consider follow up with your Primary Care Provider.  If you are age 29 or younger, your body mass index should be between 19-25. Your Body mass index is 19.66 kg/m. If this is out of the aformentioned range listed, please consider follow up with your Primary Care Provider.   ________________________________________________________  The [employer] [facility] GI providers would like to encourage you to use [facility] to communicate with providers for non-urgent requests or questions.  Due to long hold times on the telephone, sending your provider a message by [facility] may be a faster and more efficient way to get a response.  Please allow 48 business hours for a response.  Please remember that this is for non-urgent requests.  _______________________________________________________

## 2022-01-18 ENCOUNTER — Encounter: Payer: Self-pay | Admitting: Gastroenterology

## 2022-01-20 ENCOUNTER — Encounter: Payer: Self-pay | Admitting: Gastroenterology

## 2022-01-26 ENCOUNTER — Encounter: Payer: Self-pay | Admitting: Gastroenterology

## 2022-01-26 ENCOUNTER — Encounter: Payer: Self-pay | Admitting: Internal Medicine

## 2022-01-27 ENCOUNTER — Encounter: Payer: Self-pay | Admitting: Gastroenterology

## 2022-01-27 ENCOUNTER — Ambulatory Visit (AMBULATORY_SURGERY_CENTER): Payer: 59 | Admitting: Gastroenterology

## 2022-01-27 VITALS — BP 110/80 | HR 79 | Temp 98.3°F | Resp 20 | Ht 65.0 in | Wt 118.0 lb

## 2022-01-27 DIAGNOSIS — D126 Benign neoplasm of colon, unspecified: Secondary | ICD-10-CM | POA: Diagnosis not present

## 2022-01-27 DIAGNOSIS — Z1509 Genetic susceptibility to other malignant neoplasm: Secondary | ICD-10-CM

## 2022-01-27 DIAGNOSIS — K649 Unspecified hemorrhoids: Secondary | ICD-10-CM

## 2022-01-27 DIAGNOSIS — R194 Change in bowel habit: Secondary | ICD-10-CM

## 2022-01-27 DIAGNOSIS — K625 Hemorrhage of anus and rectum: Secondary | ICD-10-CM | POA: Diagnosis not present

## 2022-01-27 MED ORDER — SODIUM CHLORIDE 0.9 % IV SOLN: 500.0000 mL | Freq: Once | INTRAVENOUS | Status: DC

## 2022-01-27 MED ORDER — HYDROCORTISONE (PERIANAL) 2.5 % EX CREA: 1.0000 | TOPICAL_CREAM | Freq: Two times a day (BID) | CUTANEOUS | 0 refills | Status: DC

## 2022-01-27 NOTE — Op Note (Signed)
[facility] Patient Name: Alicia Li Procedure Date: 01/27/2022 8:00 AM MRN: [id number] Endoscopist: Thornton Park MD, MD, [id number] Age: 21 Referring MD:  Date of Birth: 2000-06-25 Gender: Female Account #: [id number] Procedure:                Colonoscopy Indications:              Anal bleeding, Change in bowel habits, PMS2-related                            Lynch Syndrome Medicines:                Monitored Anesthesia Care Procedure:                Pre-Anesthesia Assessment:                           - Prior to the procedure, a History and Physical                            was performed, and patient medications and                            allergies were reviewed. The patient's tolerance of                            previous anesthesia was also reviewed. The risks                            and benefits of the procedure and the sedation                            options and risks were discussed with the patient.                            All questions were answered, and informed consent                            was obtained. Prior Anticoagulants: The patient has                            taken no anticoagulant or antiplatelet agents. ASA                            Grade Assessment: II - A patient with mild systemic                            disease. After reviewing the risks and benefits,                            the patient was deemed in satisfactory condition to                            undergo the procedure.  After obtaining informed consent, the colonoscope                            was passed under direct vision. Throughout the                            procedure, the patient's blood pressure, pulse, and                            oxygen saturations were monitored continuously. The                            PCF-HQ190L Colonoscope was introduced through the                            anus and advanced to the 4 cm into  the ileum. A                            second forward view of the right colon was                            performed. The colonoscopy was performed with                            moderate difficulty due to significant looping and                            a tortuous colon. Successful completion of the                            procedure was aided by withdrawing and reinserting                            the scope. The patient tolerated the procedure                            well. The quality of the bowel preparation was                            good. The terminal ileum, ileocecal valve,                            appendiceal orifice, and rectum were photographed. Scope In: 8:13:44 AM Scope Out: 8:28:49 AM Scope Withdrawal Time: 0 hours 11 minutes 6 seconds  Total Procedure Duration: 0 hours 15 minutes 5 seconds  Findings:                 The perianal and digital rectal examinations were                            normal.                           Non-bleeding internal hemorrhoids were found.  The entire colon otherwise appeared normal.                            Biopsies were taken with a cold forceps for                            histology. Estimated blood loss was minimal.                           The exam was otherwise without abnormality on                            direct and retroflexion views. Complications:            No immediate complications. Estimated Blood Loss:     Estimated blood loss was minimal. Impression:               - Non-bleeding internal hemorrhoids.                           - The entire examined colon is normal. Biopsied.                           - The examination was otherwise normal on direct                            and retroflexion views. Recommendation:           - Patient has a contact number available for                            emergencies. The signs and symptoms of potential                            delayed  complications were discussed with the                            patient. Return to normal activities tomorrow.                            Written discharge instructions were provided to the                            patient.                           - Resume previous diet.                           - Continue present medications.                           - Metamucil taken every morning.                           - Await pathology results.                           -  Repeat colonoscopy in 2 years for surveillance                            given the history of Lynch Syndrome.                           - Start using Anusol HC 2.5% applied sparingly to                            your rectum twice daily with additional bleeding.                           - If you would like more information,                            MyGIHealth.com and UpToDate.com have good                            information about hemorrhoids.                           - Emerging evidence supports eating a diet of                            fruits, vegetables, grains, calcium, and yogurt                            while reducing red meat and alcohol may reduce the                            risk of colon cancer. Thornton Park MD, MD 01/27/2022 8:44:15 AM This report has been signed electronically.

## 2022-01-27 NOTE — Progress Notes (Signed)
Indication for colonoscopy: Change in bowel habits with recent blood in the stool, PMS2-related Lynch Syndrome  Please see my 01/13/22 office note for complete details. There has been no significant change in history or physical exam since that time. She remains an appropriate candidate for monitored anesthesia care in the [place].

## 2022-01-27 NOTE — Progress Notes (Signed)
A and O x3. Report to RN. Tolerated MAC anesthesia well. 

## 2022-01-27 NOTE — Patient Instructions (Addendum)
   Await results on colon biopsies   Handout on hemorrhoids given to you today  Metamucil as instructions say daily  Anusol HC 2.5% apply to rectum twice daily with any additional bleeding    YOU HAD AN ENDOSCOPIC PROCEDURE TODAY AT [facility]:   Refer to the procedure report that was given to you for any specific questions about what was found during the examination.  If the procedure report does not answer your questions, please call your gastroenterologist to clarify.  If you requested that your care partner not be given the details of your procedure findings, then the procedure report has been included in a sealed envelope for you to review at your convenience later.  YOU SHOULD EXPECT: Some feelings of bloating in the abdomen. Passage of more gas than usual.  Walking can help get rid of the air that was put into your GI tract during the procedure and reduce the bloating. If you had a lower endoscopy (such as a colonoscopy or flexible sigmoidoscopy) you may notice spotting of blood in your stool or on the toilet paper. If you underwent a bowel prep for your procedure, you may not have a normal bowel movement for a few days.  Please Note:  You might notice some irritation and congestion in your nose or some drainage.  This is from the oxygen used during your procedure.  There is no need for concern and it should clear up in a day or so.  SYMPTOMS TO REPORT IMMEDIATELY:  Following lower endoscopy (colonoscopy or flexible sigmoidoscopy):  Excessive amounts of blood in the stool  Significant tenderness or worsening of abdominal pains  Swelling of the abdomen that is new, acute  Fever of 100F or higher   For urgent or emergent issues, a gastroenterologist can be reached at any hour by calling [phone]. Do not use MyChart messaging for urgent concerns.    DIET:  We do recommend a small meal at first, but then you may proceed to your regular diet.  Drink plenty of  fluids but you should avoid alcoholic beverages for 24 hours.  ACTIVITY:  You should plan to take it easy for the rest of today and you should NOT DRIVE or use heavy machinery until tomorrow (because of the sedation medicines used during the test).    FOLLOW UP: Our staff will call the number listed on your records the next business day following your procedure.  We will call around 7:15- 8:00 am to check on you and address any questions or concerns that you may have regarding the information given to you following your procedure. If we do not reach you, we will leave a message.     If any biopsies were taken you will be contacted by phone or by letter within the next 1-3 weeks.  Please call us at [phone] if you have not heard about the biopsies in 3 weeks.    SIGNATURES/CONFIDENTIALITY: You and/or your care partner have signed paperwork which will be entered into your electronic medical record.  These signatures attest to the fact that that the information above on your After Visit Summary has been reviewed and is understood.  Full responsibility of the confidentiality of this discharge information lies with you and/or your care-partner.

## 2022-01-27 NOTE — Addendum Note (Signed)
Addended by: Shanon Brow on: 01/27/2022 09:19 AM   Modules accepted: Orders

## 2022-01-27 NOTE — Progress Notes (Signed)
Called to room to assist during endoscopic procedure.  Patient ID and intended procedure confirmed with present staff. Received instructions for my participation in the procedure from the performing physician.  

## 2022-01-28 ENCOUNTER — Telehealth: Payer: Self-pay

## 2022-01-28 NOTE — Telephone Encounter (Signed)
No answer, left message to call if having any issues or concerns, B.Weslyn Holsonback RN 

## 2022-02-19 ENCOUNTER — Ambulatory Visit: Payer: 59

## 2022-03-05 ENCOUNTER — Encounter: Payer: Self-pay | Admitting: Internal Medicine

## 2022-03-11 ENCOUNTER — Ambulatory Visit: Payer: 59 | Admitting: Internal Medicine

## 2022-04-09 ENCOUNTER — Ambulatory Visit: Payer: 59 | Admitting: Internal Medicine

## 2022-04-09 ENCOUNTER — Encounter: Payer: Self-pay | Admitting: Internal Medicine

## 2022-04-09 ENCOUNTER — Ambulatory Visit (INDEPENDENT_AMBULATORY_CARE_PROVIDER_SITE_OTHER): Payer: 59

## 2022-04-09 VITALS — BP 122/78 | HR 87 | Temp 98.3°F | Resp 16 | Ht 65.0 in | Wt 125.0 lb

## 2022-04-09 DIAGNOSIS — G8929 Other chronic pain: Secondary | ICD-10-CM

## 2022-04-09 DIAGNOSIS — L280 Lichen simplex chronicus: Secondary | ICD-10-CM | POA: Diagnosis not present

## 2022-04-09 DIAGNOSIS — Z23 Encounter for immunization: Secondary | ICD-10-CM | POA: Diagnosis not present

## 2022-04-09 DIAGNOSIS — M21932 Unspecified acquired deformity of left forearm: Secondary | ICD-10-CM | POA: Insufficient documentation

## 2022-04-09 DIAGNOSIS — M25532 Pain in left wrist: Secondary | ICD-10-CM | POA: Diagnosis not present

## 2022-04-09 NOTE — Progress Notes (Unsigned)
Subjective:  Patient ID: Alicia Li, female    DOB: Nov 14, 2000  Age: 22 y.o. MRN: [id number]  CC: Follow-up   HPI Zylah Elsbernd presents for f/up -  She complains of a 1 year history of left wrist pain and left wrist abnormality.  She has a history of repetitive activity when she worked for [employer] but denies any specific trauma or injury.  She never notices any swelling or decreased range of motion.  She does not take anything for pain.  She also continues to complain of neuropathy symptoms with a itching, burning sensation deep down in her feet.  She wants to see a neurologist again.  Outpatient Medications Prior to Visit  Medication Sig Dispense Refill   clindamycin (CLINDAGEL) 1 % gel      hydrocortisone (ANUSOL-HC) 2.5 % rectal cream Place 1 Application rectally 2 (two) times daily. 30 g 0   ondansetron (ZOFRAN) 4 MG tablet Take 1 tablet 30 minutes before drinking colonoscopy prep 2 tablet 0   pregabalin (LYRICA) 25 MG capsule Take 1 capsule (25 mg total) by mouth 3 (three) times daily. 270 capsule 1   testosterone cypionate (DEPOTESTOSTERONE CYPIONATE) 200 MG/ML injection SMARTSIG:0.4 Milliliter(s) SUB-Q Once a Week     tretinoin (RETIN-A) 0.025 % gel      No facility-administered medications prior to visit.    ROS Review of Systems  Constitutional: Negative.  Negative for diaphoresis and fatigue.  HENT: Negative.    Eyes: Negative.   Respiratory:  Negative for cough, chest tightness and wheezing.   Cardiovascular:  Negative for chest pain, palpitations and leg swelling.  Gastrointestinal:  Negative for abdominal pain.  Endocrine: Negative.   Genitourinary:  Negative for difficulty urinating.  Musculoskeletal:  Positive for arthralgias. Negative for myalgias.  Neurological: Negative.  Negative for dizziness and weakness.  Hematological:  Negative for adenopathy. Does not bruise/bleed easily.  Psychiatric/Behavioral: Negative.      Objective:  BP 122/78 (BP Location:  Right Arm, Patient Position: Sitting, Cuff Size: Large)   Pulse 87   Temp 98.3 F (36.8 C) (Oral)   Resp 16   Ht '5\' 5"'$  (1.651 m)   Wt 125 lb (56.7 kg)   LMP 03/18/2022 (Exact Date)   SpO2 99%   BMI 20.80 kg/m   BP Readings from Last 3 Encounters:  04/14/22 115/78  04/09/22 122/78  01/27/22 110/80    Wt Readings from Last 3 Encounters:  04/14/22 125 lb (56.7 kg)  04/09/22 125 lb (56.7 kg)  01/27/22 118 lb (53.5 kg)    Physical Exam Vitals reviewed.  HENT:     Mouth/Throat:     Mouth: Mucous membranes are moist.  Eyes:     General: No scleral icterus.    Conjunctiva/sclera: Conjunctivae normal.  Cardiovascular:     Rate and Rhythm: Normal rate and regular rhythm.     Heart sounds: No murmur heard. Pulmonary:     Effort: Pulmonary effort is normal.     Breath sounds: No stridor. No wheezing, rhonchi or rales.  Abdominal:     General: Abdomen is flat.     Palpations: There is no mass.     Tenderness: There is no abdominal tenderness. There is no guarding.     Hernia: No hernia is present.  Musculoskeletal:        General: Deformity present. No swelling or signs of injury. Normal range of motion.     Cervical back: Neck supple.     Right lower leg: No edema.  Comments: The left radial styloid is more prominent than the right but there is no other deformity noted in the wrists.  Lymphadenopathy:     Cervical: No cervical adenopathy.  Skin:    General: Skin is warm and dry.  Neurological:     General: No focal deficit present.  Psychiatric:        Mood and Affect: Mood normal.        Behavior: Behavior normal.     Lab Results  Component Value Date   WBC 12.5 (H) 01/11/2022   HGB 14.0 01/11/2022   HCT 43.6 01/11/2022   PLT 250 01/11/2022   GLUCOSE 79 01/13/2022   ALT 9 01/11/2022   AST 17 01/11/2022   NA 139 01/13/2022   K 4.3 01/13/2022   CL 102 01/13/2022   CREATININE 0.93 01/13/2022   BUN 16 01/13/2022   CO2 27 01/13/2022   TSH 1.81 08/01/2020     DG Wrist Complete Left  Result Date: 04/09/2022 CLINICAL DATA:  Pain EXAM: LEFT WRIST - COMPLETE 4 VIEW COMPARISON:  None Available. FINDINGS: There is no evidence of fracture or dislocation. There is no evidence of arthropathy or other focal bone abnormality. Soft tissues are unremarkable. IMPRESSION: Negative. Electronically Signed   By: Sammie Bench M.D.   On: 04/09/2022 16:14     Assessment & Plan:   [name] was seen today for follow-up.  Diagnoses and all orders for this visit:  Deformity of left wrist -     DG Wrist Complete Left; Future  Neurodermatitis -     Ambulatory referral to Neurology  Chronic pain of left wrist -     Ambulatory referral to Orthopedic Surgery  Other orders -     Flu Vaccine QUAD 6+ mos PF IM (Fluarix Quad PF)   I am having [name] maintain her tretinoin, clindamycin, testosterone cypionate, pregabalin, ondansetron, and hydrocortisone.  No orders of the defined types were placed in this encounter.    Follow-up: No follow-ups on file.  [name], MD

## 2022-04-10 ENCOUNTER — Ambulatory Visit: Payer: 59 | Admitting: Orthopaedic Surgery

## 2022-04-10 ENCOUNTER — Encounter: Payer: Self-pay | Admitting: Internal Medicine

## 2022-04-14 ENCOUNTER — Ambulatory Visit: Payer: 59 | Admitting: Internal Medicine

## 2022-04-14 ENCOUNTER — Ambulatory Visit (INDEPENDENT_AMBULATORY_CARE_PROVIDER_SITE_OTHER): Payer: 59 | Admitting: Orthopaedic Surgery

## 2022-04-14 ENCOUNTER — Encounter: Payer: Self-pay | Admitting: Orthopaedic Surgery

## 2022-04-14 VITALS — BP 115/78 | HR 76 | Ht 63.0 in | Wt 125.0 lb

## 2022-04-14 DIAGNOSIS — M654 Radial styloid tenosynovitis [de Quervain]: Secondary | ICD-10-CM | POA: Insufficient documentation

## 2022-04-14 NOTE — Progress Notes (Signed)
Office Visit Note   Patient: Alicia Li           Date of Birth: 06-28-2000           MRN: [id number] Visit Date: 04/14/2022              Requested by: Janith Lima, MD [address] [place],   [address] PCP: Janith Lima, MD   Assessment & Plan: Visit Diagnoses:  1. Tendinitis, de Quervain's     Plan: Radial gutter splint applied symptoms persist or increase she could return for an injection.  Pathophysiology discussed.  Follow-Up Instructions: Return if symptoms worsen or fail to improve.   Orders:  No orders of the defined types were placed in this encounter.  No orders of the defined types were placed in this encounter.     Procedures: No procedures performed   Clinical Data: No additional findings.   Subjective: Chief Complaint  Patient presents with   Left Wrist - Pain    HPI 22 year old female was working at [employer] and noticed gradual progressive left radial wrist pain and swelling over the first dorsal compartment.  She is now stopped working there is just working in school does not plan to return.  She has noticed discomfort.  She was in a wrist splint but did not immobilize her thumb and did not really notice any improvement.  She has not taken any medications for her symptoms.  Family history of Lynch syndrome.  Review of Systems noncontributory.   Objective: Vital Signs: BP 115/78   Pulse 76   Ht '5\' 3"'$  (1.6 m)   Wt 125 lb (56.7 kg)   LMP 03/18/2022 (Exact Date)   BMI 22.14 kg/m   Physical Exam Constitutional:      Appearance: She is well-developed.  HENT:     Head: Normocephalic.     Right Ear: External ear normal.     Left Ear: External ear normal. There is no impacted cerumen.  Eyes:     Pupils: Pupils are equal, round, and reactive to light.  Neck:     Thyroid: No thyromegaly.     Trachea: No tracheal deviation.  Cardiovascular:     Rate and Rhythm: Normal rate.  Pulmonary:     Effort: Pulmonary effort is normal.   Abdominal:     Palpations: Abdomen is soft.  Musculoskeletal:     Cervical back: No rigidity.  Skin:    General: Skin is warm and dry.  Neurological:     Mental Status: She is alert and oriented to person, place, and time.  Psychiatric:        Behavior: Behavior normal.     Ortho Exam positive Finkelstein test tenderness over the A1 pulley.  Her tenderness is rated at moderate.  Carpal tunnel exam is normal good cervical range of motion.  Specialty Comments:  No specialty comments available.  Imaging: No results found.   PMFS History: Patient Active Problem List   Diagnosis Date Noted   Tendinitis, de Quervain's 04/14/2022   Deformity of left wrist 04/09/2022   PMS2-related Lynch syndrome (HNPCC4) 07/24/2020   Herpes labialis without complication 98/92/1194   Neurodermatitis, localized 06/22/2017   Psychophysiological insomnia 06/21/2017   MDD (major depressive disorder), recurrent severe, without psychosis (Joanna) 04/14/2017   Family history of Lynch syndrome    Family history of colon cancer    Routine general medical examination at a health care facility 01/30/2013   Past Medical History:  Diagnosis Date  Family history of colon cancer    Family history of Lynch syndrome    Lynch syndrome     Family History  Problem Relation Age of Onset   Colon polyps Mother    Other Mother        Lynch syndrome - PMS2 pos   Diabetes Father    Other Maternal Grandmother        Lynch syndrome   Colon cancer Maternal Grandfather 55   Heart disease Paternal Grandfather    Diabetes Paternal Grandfather    Other Maternal Aunt        PMS2 neg   Colon cancer Other 41       MGFs brother   Colon cancer Other 13       MGF's brother   Colon cancer Other 6       MGFs sister - MLH1 pos   Cervical cancer Paternal Great-grandmother     Past Surgical History:  Procedure Laterality Date   TONSILLECTOMY     Social History   Occupational History   Occupation: student/[employer]   Tobacco Use   Smoking status: Some Days    Types: Cigars   Smokeless tobacco: Never   Tobacco comments:    patient admitted to smoking 1 black and white cigar/day  Vaping Use   Vaping Use: Never used  Substance and Sexual Activity   Alcohol use: Yes    Comment: occasionally   Drug use: Yes    Types: Marijuana    Comment: daily- Last used 11/6   Sexual activity: Not Currently    Partners: Female    Birth control/protection: Abstinence

## 2022-07-14 ENCOUNTER — Ambulatory Visit: Payer: 59 | Admitting: Internal Medicine

## 2022-07-22 ENCOUNTER — Other Ambulatory Visit: Payer: Self-pay | Admitting: Internal Medicine

## 2022-07-22 DIAGNOSIS — L280 Lichen simplex chronicus: Secondary | ICD-10-CM

## 2022-08-18 ENCOUNTER — Encounter: Payer: Self-pay | Admitting: Medical

## 2022-08-18 ENCOUNTER — Ambulatory Visit (INDEPENDENT_AMBULATORY_CARE_PROVIDER_SITE_OTHER): Payer: 59 | Admitting: Medical

## 2022-08-18 VITALS — BP 118/70 | HR 80 | Ht 65.0 in | Wt 128.0 lb

## 2022-08-18 DIAGNOSIS — F649 Gender identity disorder, unspecified: Secondary | ICD-10-CM | POA: Diagnosis not present

## 2022-08-18 DIAGNOSIS — Z9229 Personal history of other drug therapy: Secondary | ICD-10-CM

## 2022-08-18 NOTE — Patient Instructions (Addendum)
RESOURCES in [place], [place]  If you are experiencing a mental health crisis or an emergency, please call 911 or go to the nearest emergency department.  [facility]   [phone] [facility]  [phone] [facility]   [phone]  Suicide Hotline 1-800-Suicide 276-557-6332)  National Suicide Prevention Lifeline 916-253-2207  8017056712)  Domestic Violence, Rape/Crisis - [facility] [place] [phone]    Psychiatry and Counseling services  Tamela Oddi, [place] [address], [place], [place] [address] [phone]   [facility] Psychiatry [address] [place], [place] [address] [phone]   [facility] [address], [place], [place] [address] [phone]  Dr. Meredith Staggers, psychiatrist Dr. Beverly Milch, child psychiatrist   Dr. Milagros Evener, psychiatry [address], [place], [place] [address] [phone]    [facility] Crisis Line and Main phone number [phone]  [facility]al [facility] Urgent Care  [phone]  [facility] [phone]  Ad[facility] Crisis Center [phone]    Dr. Kearney Hard [place] [phone] [address] [place], [place] [address]   [facility] [address], [place], [place] [address] [phone]

## 2022-08-18 NOTE — Progress Notes (Addendum)
Subjective:  Alicia Li is a 22 y.o. female who presents for Chief Complaint  Patient presents with   New Patient (Initial Visit)    New patient consult wanting "top surgery." Consultation for surgery at [facility] is 08/20/22.      Here as a new patient today.   Medical team: Dr. Marcello Moores, PCP [facility]  Alicia Li notes that they have a consult scheduled with [facility] 08/24/22 for requesting "top surgery."  Alicia Li notes being a biological female, but has not felt comfortable as a female since teenage years.  Alicia Li has discussed this with his mother who he confides in.  Alicia Li has been on hormone/testosterone therapy for about 2 years.    Alicia Li initially noted that he had not talked to Dr. Yetta Barre, his PCP, about this but later in the conversation did say he had discussed prior with Dr. Yetta Barre.   He notes that he was not advised to get referral for surgery from his PCP but rather was given our office name as a resource from [employer].  Alicia Li notes that he has been considering surgery as he is embarrassed about breasts showing, and has felt not female since teenage years  He notes that he has never seen a counselor for this concern for gender concerns, transgender transition.  He just started a new job.   He graduated recently with associates degree from [employer].  Is wanting to become a Child psychotherapist.   No other aggravating or relieving factors.    No other c/o.  The following portions of the patient's history were reviewed and updated as appropriate: allergies, current medications, past family history, past medical history, past social history, past surgical history and problem list.  ROS Otherwise as in subjective above  Objective: BP 118/70   Pulse 80   Ht 5\' 5"  (1.651 m)   Wt 128 lb (58.1 kg)   BMI 21.30 kg/m   General appearance: alert, no distress, well developed, well nourished     Assessment: Encounter Diagnoses  Name Primary?   Gender dysphoria Yes   History of  hormone therapy      Plan: We discussed Alicia Li' overall concerns.   He has a great PCP already in Dr. Yetta Barre.   Alicia Li stated today that his reason for coming here was not to pursue primary care here, but specifically for a referral for surgery consult.  I advised that it sounds like he was referred here from [facility] without maybe the best advice on how to move forward.   I politely and compassionately advised that I don't treat or manage gender dysphoria /transgender therapies specifically, although other providers at this office do some treatment in that regard.  I did advise that at minimal even our other providers like my self would insist on counseling and psychological eval before pursuing life altering surgery for gender dysphoria issues.   I also advised that Alicia Li should make sure their PCP whom he trusts, Dr Yetta Barre, discuss the concerns as well.    I reiterated to Alicia Li that we take care of primary care needs, and this is a specific referral request that would not be appropriate from [place] since he has an established PCP already.   I gave Alicia Li a list of counseling and psychiatry resources.  I advised that any surgical center practicing gender reassignment surgery should be requiring input from PCP and counseling, and that we wouldn't refer for surgery consult without counseling input.    Alicia Li voiced understanding.  I advised that Alicia Li follow up with Dr. Yetta Barre, his PCP, and pursue an appointment for counseling and psychology before moving forward with surgery.  He still plans on having the surgical consult, but advised he also pursue this other recommendation.   [name] was seen today for new patient (initial visit).  Diagnoses and all orders for this visit:  Gender dysphoria  History of hormone therapy  Return with cousneling and PCP Dr. Yetta Barre.

## 2022-08-27 ENCOUNTER — Telehealth (INDEPENDENT_AMBULATORY_CARE_PROVIDER_SITE_OTHER): Payer: 59 | Admitting: Nurse Practitioner

## 2022-08-27 DIAGNOSIS — J329 Chronic sinusitis, unspecified: Secondary | ICD-10-CM | POA: Insufficient documentation

## 2022-08-27 NOTE — Progress Notes (Signed)
   Established Patient Office Visit  An audio/visual tele-health visit was completed today for this patient. I connected with  Alicia Li on 08/27/22 utilizing audio/visual technology and verified that I am speaking with the correct person using two identifiers. The patient was located at their car, and I was located at the office of [facility] at [facility] during the encounter. I discussed the limitations of evaluation and management by telemedicine. The patient expressed understanding and agreed to proceed.    Subjective   Patient ID: Alicia Li, female    DOB: 14-May-2000  Age: 22 y.o. MRN: [id number]  Chief Complaint  Patient presents with   Sinus Problem    Symptom onset 3 days ago, is experiencing congestion, runny nose, postnasal drip, and headache.  Denies history of frequent sinus infections.  Has not tested for COVID-19.  Has not tried any over-the-counter medication to treat symptoms.    Review of Systems  Constitutional:  Negative for chills and fever.  HENT:  Positive for congestion.        (+) runny nose (+) PND  Respiratory:  Negative for cough, shortness of breath and wheezing.   Cardiovascular:  Negative for chest pain.  Neurological:  Positive for dizziness and headaches.      Objective:     There were no vitals taken for this visit.   Physical Exam Comprehensive physical exam not completed today as office visit was conducted remotely.  No acute respiratory distress noted, patient appears fairly well over video.  Patient was alert and oriented, and appeared to have appropriate judgment.   No results found for any visits on 08/27/22.    The ASCVD Risk score (Arnett DK, et al., 2019) failed to calculate for the following reasons:   The 2019 ASCVD risk score is only valid for ages 46 to 57    Assessment & Plan:   Problem List Items Addressed This Visit       Respiratory   Sinusitis - Primary    Acute Likely viral in  origin Recommend symptomatic treatment with rest, hydration, over-the-counter Mucinex DM as needed, Flonase nasal spray as needed.  Patient encouraged to call office if symptoms persist or worsen past 10 to 14 days.  Patient reports understanding.       Return if symptoms worsen or fail to improve.    Elenore Paddy, NP

## 2022-08-27 NOTE — Assessment & Plan Note (Signed)
Acute Likely viral in origin Recommend symptomatic treatment with rest, hydration, over-the-counter Mucinex DM as needed, Flonase nasal spray as needed.  Patient encouraged to call office if symptoms persist or worsen past 10 to 14 days.  Patient reports understanding.

## 2022-09-21 ENCOUNTER — Telehealth: Payer: Self-pay | Admitting: Internal Medicine

## 2022-09-21 NOTE — Telephone Encounter (Signed)
Patient has a referral to neurology, but she would like it changed to [facility] neurology. Best callback is [phone].

## 2022-10-05 ENCOUNTER — Other Ambulatory Visit: Payer: Self-pay | Admitting: Medical

## 2022-10-06 ENCOUNTER — Ambulatory Visit (INDEPENDENT_AMBULATORY_CARE_PROVIDER_SITE_OTHER): Payer: 59 | Admitting: Internal Medicine

## 2022-10-06 ENCOUNTER — Other Ambulatory Visit (HOSPITAL_BASED_OUTPATIENT_CLINIC_OR_DEPARTMENT_OTHER): Payer: Self-pay

## 2022-10-06 ENCOUNTER — Other Ambulatory Visit: Payer: Self-pay | Admitting: Internal Medicine

## 2022-10-06 VITALS — BP 124/78 | HR 68 | Temp 98.1°F | Ht 65.0 in | Wt 128.0 lb

## 2022-10-06 DIAGNOSIS — Z789 Other specified health status: Secondary | ICD-10-CM | POA: Diagnosis not present

## 2022-10-06 DIAGNOSIS — L280 Lichen simplex chronicus: Secondary | ICD-10-CM | POA: Diagnosis not present

## 2022-10-06 LAB — CBC WITH DIFFERENTIAL/PLATELET
Basophils Absolute: 0.1 10*3/uL (ref 0.0–0.1)
Basophils Relative: 0.7 % (ref 0.0–3.0)
Eosinophils Absolute: 0.1 10*3/uL (ref 0.0–0.7)
Eosinophils Relative: 1.5 % (ref 0.0–5.0)
HCT: 51.4 % — ABNORMAL HIGH (ref 36.0–46.0)
Hemoglobin: 15.8 g/dL — ABNORMAL HIGH (ref 12.0–15.0)
Lymphocytes Relative: 24.5 % (ref 12.0–46.0)
Lymphs Abs: 2.3 10*3/uL (ref 0.7–4.0)
MCHC: 30.7 g/dL (ref 30.0–36.0)
MCV: 90.8 fl (ref 78.0–100.0)
Monocytes Absolute: 0.6 10*3/uL (ref 0.1–1.0)
Monocytes Relative: 6.1 % (ref 3.0–12.0)
Neutro Abs: 6.4 10*3/uL (ref 1.4–7.7)
Neutrophils Relative %: 67.2 % (ref 43.0–77.0)
Platelets: 242 10*3/uL (ref 150.0–400.0)
RBC: 5.66 Mil/uL — ABNORMAL HIGH (ref 3.87–5.11)
RDW: 14.1 % (ref 11.5–15.5)
WBC: 9.4 10*3/uL (ref 4.0–10.5)

## 2022-10-06 LAB — VITAMIN B12: Vitamin B-12: 362 pg/mL (ref 211–911)

## 2022-10-06 LAB — FOLATE: Folate: 15.9 ng/mL (ref >5.9)

## 2022-10-06 LAB — TSH: TSH: 3.08 u[IU]/mL (ref 0.35–5.50)

## 2022-10-06 MED ORDER — PREGABALIN 50 MG PO CAPS
50.0000 mg | ORAL_CAPSULE | Freq: Three times a day (TID) | ORAL | 0 refills | Status: DC
  Filled 2022-10-06: qty 90, 30d supply, fill #0
  Filled 2023-04-10 – 2023-04-22 (×2): qty 90, 30d supply, fill #1
  Filled 2023-06-20: qty 90, 30d supply, fill #2

## 2022-10-06 NOTE — Patient Instructions (Signed)
Neuropathic Pain Neuropathic pain is pain caused by damage to the nerves that are responsible for certain sensations in your body (sensory nerves). Neuropathic pain can make you more sensitive to pain. Even a minor sensation can feel very painful. This is usually a long-term (chronic) condition that can be difficult to treat. The type of pain differs from person to person. It may: Start suddenly (acute), or it may develop slowly and become chronic. Come and go as damaged nerves heal, or it may stay at the same level for years. Cause emotional distress, loss of sleep, and a lower quality of life. What are the causes? The most common cause of this condition is diabetes. Many other diseases and conditions can also cause neuropathic pain. Causes of neuropathic pain can be classified as: Toxic. This is caused by medicines and chemicals. The most common causes of toxic neuropathic pain is damage from medicines that kill cancer cells (chemotherapy) or alcohol abuse. Metabolic. This can be caused by: Diabetes. Lack of vitamins like B12. Traumatic. Any injury that cuts, crushes, or stretches a nerve can cause damage and pain. Compression-related. If a sensory nerve gets trapped or compressed for a long period of time, the blood supply to the nerve can be cut off. Vascular. Many blood vessel diseases can cause neuropathic pain by decreasing blood supply and oxygen to nerves. Autoimmune. This type of pain results from diseases in which the body's defense system (immune system) mistakenly attacks sensory nerves. Examples of autoimmune diseases that can cause neuropathic pain include lupus and multiple sclerosis. Infectious. Many types of viral infections can damage sensory nerves and cause pain. Shingles infection is a common cause of this type of pain. Inherited. Neuropathic pain can be a symptom of many diseases that are passed down through families (genetic). What increases the risk? You are more likely to  develop this condition if: You have diabetes. You smoke. You drink too much alcohol. You are taking certain medicines, including chemotherapy or medicines that treat immune system disorders. What are the signs or symptoms? The main symptom is pain. Neuropathic pain is often described as: Burning. Shock-like. Stinging. Hot or cold. Itching. How is this diagnosed? No single test can diagnose neuropathic pain. It is diagnosed based on: A physical exam and your symptoms. Your health care provider will ask you about your pain. You may be asked to use a pain scale to describe how bad your pain is. Tests. These may be done to see if you have a cause and location of any nerve damage. They include: Nerve conduction studies and electromyography to test how well nerve signals travel through your nerves and muscles (electrodiagnostic testing). Skin biopsy to evaluate for small fiber neuropathy. Imaging studies, such as: X-rays. CT scan. MRI. How is this treated? Treatment for neuropathic pain may change over time. You may need to try different treatment options or a combination of treatments. Some options include: Treating the underlying cause of the neuropathy, such as diabetes, kidney disease, or vitamin deficiencies. Stopping medicines that can cause neuropathy, such as chemotherapy. Medicine to relieve pain. Medicines may include: Prescription or over-the-counter pain medicine. Anti-seizure medicine. Antidepressant medicines. Pain-relieving patches or creams that are applied to painful areas of skin. A medicine to numb the area (local anesthetic), which can be injected as a nerve block. Transcutaneous nerve stimulation. This uses electrical currents to block painful nerve signals. The treatment is painless. Alternative treatments, such as: Acupuncture. Meditation. Massage. Occupational or physical therapy. Pain management programs. Counseling. Follow   these instructions at  home: Medicines  Take over-the-counter and prescription medicines only as told by your health care provider. Ask your health care provider if the medicine prescribed to you: Requires you to avoid driving or using machinery. Can cause constipation. You may need to take these actions to prevent or treat constipation: Drink enough fluid to keep your urine pale yellow. Take over-the-counter or prescription medicines. Eat foods that are high in fiber, such as beans, whole grains, and fresh fruits and vegetables. Limit foods that are high in fat and processed sugars, such as fried or sweet foods. Lifestyle  Have a good support system at home. Consider joining a chronic pain support group. Do not use any products that contain nicotine or tobacco. These products include cigarettes, chewing tobacco, and vaping devices, such as e-cigarettes. If you need help quitting, ask your health care provider. Do not drink alcohol. General instructions Learn as much as you can about your condition. Work closely with all your health care providers to find the treatment plan that works best for you. Ask your health care provider what activities are safe for you. Keep all follow-up visits. This is important. Contact a health care provider if: Your pain treatments are not working. You are having side effects from your medicines. You are struggling with tiredness (fatigue), mood changes, depression, or anxiety. Get help right away if: You have thoughts of hurting yourself. Get help right away if you feel like you may hurt yourself or others, or have thoughts about taking your own life. Go to your nearest emergency room or: Call 911. Call the National Suicide Prevention Lifeline at 1-800-273-8255 or 988. This is open 24 hours a day. Text the Crisis Text Line at 741741. Summary Neuropathic pain is pain caused by damage to the nerves that are responsible for certain sensations in your body (sensory  nerves). Neuropathic pain may come and go as damaged nerves heal, or it may stay at the same level for years. Neuropathic pain is usually a long-term condition that can be difficult to treat. Consider joining a chronic pain support group. This information is not intended to replace advice given to you by your health care provider. Make sure you discuss any questions you have with your health care provider. Document Revised: 11/04/2020 Document Reviewed: 11/04/2020 Elsevier Patient Education  2024 [employer]  

## 2022-10-06 NOTE — Progress Notes (Unsigned)
Subjective:  Patient ID: Alicia Li, female    DOB: 09/01/2000  Age: 22 y.o. MRN: [id number]  CC: No chief complaint on file.   HPI Alicia Li presents for f/up ---  Discussed the use of AI scribe software for clinical note transcription with the patient, who gave verbal consent to proceed.  History of Present Illness   The patient, with a history of neuropathic symptoms, presents with complaints of numbness and tingling in the extremities. The numbness is most pronounced in the right great toe, which is described as 'completely numb.' They also experience tingling in the hands, particularly upon waking, with associated stiffness and difficulty in closing the hands. The numbness and tingling are position-dependent, with certain postures exacerbating the symptoms. The patient also reports numbness in the arms, which is mitigated by positioning. The symptoms are not associated with weakness.  The patient has a history of upper back pain, which previously resulted in work cessation. However, they deny current neck or low back pain. They have been on Lyrica, which provides partial relief of the symptoms.  The patient also reports numbness in the arm when bending over, such as when brushing teeth. They have a history of seeing a neurologist at the age of 60, but no significant interventions or tests were performed at that time.       Outpatient Medications Prior to Visit  Medication Sig Dispense Refill   clindamycin (CLINDAGEL) 1 % gel      tretinoin (RETIN-A) 0.025 % gel      pregabalin (LYRICA) 25 MG capsule TAKE 1 CAPSULE(25 MG) BY MOUTH THREE TIMES DAILY 270 capsule 0   testosterone cypionate (DEPOTESTOSTERONE CYPIONATE) 200 MG/ML injection Inject 100 mg into the muscle every 14 (fourteen) days.     No facility-administered medications prior to visit.    ROS Review of Systems  Objective:  BP 124/78 (BP Location: Left Arm, Patient Position: Sitting, Cuff Size: Large)    Pulse 68   Temp 98.1 F (36.7 C) (Oral)   Ht 5\' 5"  (1.651 m)   Wt 128 lb (58.1 kg)   SpO2 98%   BMI 21.30 kg/m   BP Readings from Last 3 Encounters:  10/06/22 124/78  08/18/22 118/70  04/14/22 115/78    Wt Readings from Last 3 Encounters:  10/06/22 128 lb (58.1 kg)  08/18/22 128 lb (58.1 kg)  04/14/22 125 lb (56.7 kg)    Physical Exam Neurological:     Cranial Nerves: Cranial nerves 2-12 are intact.     Sensory: Sensation is intact.     Motor: Motor function is intact. No weakness or atrophy.     Coordination: Coordination is intact.     Gait: Gait is intact.     Deep Tendon Reflexes: Reflexes normal.     Reflex Scores:      Tricep reflexes are 1+ on the right side and 1+ on the left side.      Bicep reflexes are 1+ on the right side and 1+ on the left side.      Brachioradialis reflexes are 1+ on the right side and 1+ on the left side.      Patellar reflexes are 1+ on the right side and 1+ on the left side.      Achilles reflexes are 1+ on the right side and 1+ on the left side.    Lab Results  Component Value Date   WBC 9.4 10/06/2022   HGB 15.8 (H) 10/06/2022   HCT 51.4 (  H) 10/06/2022   PLT 242.0 10/06/2022   GLUCOSE 79 01/13/2022   ALT 9 01/11/2022   AST 17 01/11/2022   NA 139 01/13/2022   K 4.3 01/13/2022   CL 102 01/13/2022   CREATININE 0.93 01/13/2022   BUN 16 01/13/2022   CO2 27 01/13/2022   TSH 3.08 10/06/2022    No results found.  Assessment & Plan:   Neurodermatitis, localized -     Pregabalin; Take 1 capsule (50 mg total) by mouth 3 (three) times daily.  Dispense: 270 capsule; Refill: 0 -     TSH; Future -     Vitamin B1; Future -     Folate; Future -     Vitamin B12; Future -     CBC with Differential/Platelet; Future -     Vitamin B6; Future -     Ambulatory referral to Neurology  Female-to-female transgender person -     Testosterone Cypionate; Inject 0.5 mLs (100 mg total) into the muscle every 14 (fourteen) days.  Dispense: 10 mL;  Refill: 0     Follow-up: Return in about 6 months (around 04/08/2023).  Sanda Linger, MD

## 2022-10-07 ENCOUNTER — Other Ambulatory Visit (HOSPITAL_BASED_OUTPATIENT_CLINIC_OR_DEPARTMENT_OTHER): Payer: Self-pay

## 2022-10-07 MED ORDER — TESTOSTERONE CYPIONATE 200 MG/ML IM SOLN
100.0000 mg | INTRAMUSCULAR | 0 refills | Status: DC
  Filled 2022-10-07 – 2023-03-26 (×2): qty 2, 28d supply, fill #0

## 2022-10-09 ENCOUNTER — Encounter: Payer: Self-pay | Admitting: Internal Medicine

## 2022-10-10 ENCOUNTER — Encounter: Payer: Self-pay | Admitting: Internal Medicine

## 2022-10-11 LAB — VITAMIN B1: Vitamin B1 (Thiamine): 13 nmol/L (ref 8–30)

## 2022-10-11 LAB — VITAMIN B6: Vitamin B6: 19.4 ng/mL (ref 2.1–21.7)

## 2022-10-12 ENCOUNTER — Other Ambulatory Visit (HOSPITAL_BASED_OUTPATIENT_CLINIC_OR_DEPARTMENT_OTHER): Payer: Self-pay

## 2022-11-02 ENCOUNTER — Other Ambulatory Visit (HOSPITAL_BASED_OUTPATIENT_CLINIC_OR_DEPARTMENT_OTHER): Payer: Self-pay

## 2022-11-02 MED ORDER — TESTOSTERONE CYPIONATE 200 MG/ML IM SOLN
100.0000 mg | INTRAMUSCULAR | 0 refills | Status: DC
  Filled 2022-11-03: qty 4, 28d supply, fill #0
  Filled 2023-04-10: qty 2, 14d supply, fill #1
  Filled 2023-04-22: qty 2, 28d supply, fill #1

## 2022-11-03 ENCOUNTER — Other Ambulatory Visit (HOSPITAL_BASED_OUTPATIENT_CLINIC_OR_DEPARTMENT_OTHER): Payer: Self-pay

## 2023-01-07 DIAGNOSIS — Z789 Other specified health status: Secondary | ICD-10-CM | POA: Insufficient documentation

## 2023-01-13 ENCOUNTER — Other Ambulatory Visit (HOSPITAL_BASED_OUTPATIENT_CLINIC_OR_DEPARTMENT_OTHER): Payer: Self-pay

## 2023-01-13 MED ORDER — SULFAMETHOXAZOLE-TRIMETHOPRIM 800-160 MG PO TABS
1.0000 | ORAL_TABLET | Freq: Two times a day (BID) | ORAL | 0 refills | Status: DC
  Filled 2023-01-13: qty 10, 5d supply, fill #0

## 2023-01-19 ENCOUNTER — Other Ambulatory Visit (HOSPITAL_BASED_OUTPATIENT_CLINIC_OR_DEPARTMENT_OTHER): Payer: Self-pay

## 2023-01-28 ENCOUNTER — Other Ambulatory Visit (HOSPITAL_BASED_OUTPATIENT_CLINIC_OR_DEPARTMENT_OTHER): Payer: Self-pay

## 2023-01-28 ENCOUNTER — Encounter: Payer: Self-pay | Admitting: Neurology

## 2023-01-28 ENCOUNTER — Ambulatory Visit: Payer: 59 | Admitting: Neurology

## 2023-01-28 VITALS — BP 135/80 | HR 90 | Ht 63.0 in | Wt 132.5 lb

## 2023-01-28 DIAGNOSIS — G629 Polyneuropathy, unspecified: Secondary | ICD-10-CM

## 2023-01-28 DIAGNOSIS — R208 Other disturbances of skin sensation: Secondary | ICD-10-CM

## 2023-01-28 NOTE — Progress Notes (Addendum)
[facility]  PATIENT: Alicia Li DOB: Feb 06, 2001  REFERRING DOCTOR OR PCP:  Sanda Linger, MD SOURCE:   Patient, notes from primary care, notes from neurology  _________________________________   HISTORICAL  CHIEF COMPLAINT:  Chief Complaint  Patient presents with   Room 11    Pt is here Alone. Pt states that he is having numbness in both of his feet more in the right foot than left. Pt states that he gets and electric shock in his feet that comes everyday and more intense when he is tired. Pt states that he has gait issues and feel uneasy when he walks. Pt states that he gets muscle spasms in his lower back and face that get tight. Pt states that he has tremors. Pt states that he stutters a little bit. Pt states that he has blurry vision.     HISTORY OF PRESENT ILLNESS:  I had the pleasure of seeing your patient, Alicia Li, at [facility] for neurologic consultation regarding his foot numbness and dysesthesia.  He is a 23 yo with numbness involving the side and bottom of the great toe on both feet.   He also reports a shocklike sensation that stays in the feet, mostly in the soles.  These symptoms started around 2015.   However, they have worsened over the past year.   He takes pregabalin 50 mg now and then at night if symptoms intensify.    He saw Dr. Anne Hahn in 2018 with similar pruritic dysesthesia and was placed on Norco. He was diagnosed with neurodermatitis. Studies were not done at that time.  No FH of neuropathy.  He has the genetic mutation associate with Lynch syndrome and many famuly members have had cancer.     He Is on hormone treatment (testosterone) and is a female to female individual  REVIEW OF SYSTEMS: Constitutional: No fevers, chills, sweats, or change in appetite.  F2M transgender on testosterone Eyes: No visual changes, double vision, eye pain Ear, nose and throat: No hearing loss, ear pain, nasal congestion, sore  throat Cardiovascular: No chest pain, palpitations Respiratory:  No shortness of breath at rest or with exertion.   No wheezes GastrointestinaI: No nausea, vomiting, diarrhea, abdominal pain, fecal incontinence Genitourinary:  No dysuria, urinary retention or frequency.  No nocturia. Musculoskeletal:  No neck pain, back pain Integumentary: No rash, pruritus, skin lesions Neurological: as above Psychiatric: No depression at this time.  No anxiety Endocrine: No palpitations, diaphoresis, change in appetite, change in weigh or increased thirst Hematologic/Lymphatic:  No anemia, purpura, petechiae. Allergic/Immunologic: No itchy/runny eyes, nasal congestion, recent allergic reactions, rashes  ALLERGIES: No Known Allergies  HOME MEDICATIONS:  Current Outpatient Medications:    clindamycin (CLINDAGEL) 1 % gel, , Disp: , Rfl:    pregabalin (LYRICA) 50 MG capsule, Take 1 capsule (50 mg total) by mouth 3 (three) times daily., Disp: 270 capsule, Rfl: 0   sulfamethoxazole-trimethoprim (BACTRIM DS) 800-160 MG tablet, Take 1 tablet by mouth every 12 (twelve) hours for 5 days as directed, Disp: 10 tablet, Rfl: 0   testosterone cypionate (DEPOTESTOSTERONE CYPIONATE) 200 MG/ML injection, Inject 0.5 mLs (100 mg total) into the muscle every 14 (fourteen) days., Disp: 10 mL, Rfl: 0   testosterone cypionate (DEPOTESTOSTERONE CYPIONATE) 200 MG/ML injection, Inject 0.5 mLs (100 mg total) into the skin every 7 (seven) days., Disp: 6 mL, Rfl: 0   tretinoin (RETIN-A) 0.025 % gel, , Disp: , Rfl:   PAST MEDICAL HISTORY: Past Medical History:  Diagnosis  Date   Family history of colon cancer    Family history of Lynch syndrome    Lynch syndrome     PAST SURGICAL HISTORY: Past Surgical History:  Procedure Laterality Date   TONSILLECTOMY      FAMILY HISTORY: Family History  Problem Relation Age of Onset   Colon polyps Mother    Other Mother        Lynch syndrome - PMS2 pos   Diabetes Father    Other  Maternal Grandmother        Lynch syndrome   Colon cancer Maternal Grandfather 55   Heart disease Paternal Grandfather    Diabetes Paternal Grandfather    Other Maternal Aunt        PMS2 neg   Colon cancer Other 83       MGFs brother   Colon cancer Other 13       MGF's brother   Colon cancer Other 10       MGFs sister - MLH1 pos   Cervical cancer Paternal Great-grandmother     SOCIAL HISTORY: Social History   Socioeconomic History   Marital status: Single    Spouse name: Not on file   Number of children: 0   Years of education: Not on file   Highest education level: Associate degree: occupational, Scientist, product/process development, or vocational program  Occupational History   Occupation: student/[employer]  Tobacco Use   Smoking status: Some Days    Types: Cigars   Smokeless tobacco: Never   Tobacco comments:    patient admitted to smoking 1 black and white cigar/day  Vaping Use   Vaping status: Never Used  Substance and Sexual Activity   Alcohol use: Yes    Comment: occasionally   Drug use: Yes    Types: Marijuana    Comment: daily- Last used 11/6   Sexual activity: Not Currently    Partners: Female    Birth control/protection: Abstinence  Other Topics Concern   Not on file  Social History Narrative   Not on file   Social Determinants of Health   Financial Resource Strain: Low Risk  (10/06/2022)   Overall Financial Resource Strain (CARDIA)    Difficulty of Paying Living Expenses: Not hard at all  Food Insecurity: No Food Insecurity (10/06/2022)   Hunger Vital Sign    Worried About Running Out of Food in the Last Year: Never true    Ran Out of Food in the Last Year: Never true  Transportation Needs: No Transportation Needs (10/06/2022)   PRAPARE - Administrator, Civil Service (Medical): No    Lack of Transportation (Non-Medical): No  Physical Activity: Sufficiently Active (10/06/2022)   Exercise Vital Sign    Days of Exercise per Week: 5 days    Minutes of Exercise per  Session: 130 min  Stress: No Stress Concern Present (10/06/2022)   [employer] of Occupational Health - Occupational Stress Questionnaire    Feeling of Stress : Only a little  Social Connections: Unknown (10/06/2022)   Social Connection and Isolation Panel [NHANES]    Frequency of Communication with Friends and Family: More than three times a week    Frequency of Social Gatherings with Friends and Family: Once a week    Attends Religious Services: Patient declined    Database administrator or Organizations: No    Attends Engineer, structural: Not on file    Marital Status: Patient declined  Intimate Partner Violence: Not on file  PHYSICAL EXAM  Vitals:   01/28/23 1026  BP: 135/80  Pulse: 90  Weight: 132 lb 8 oz (60.1 kg)  Height: 5\' 3"  (1.6 m)    Body mass index is 23.47 kg/m.   General: The patient is well-developed and well-nourished and in no acute distress  HEENT:  Head is /AT.  Sclera are anicteric.     Neck: No carotid bruits are noted.  The neck is nontender.  Cardiovascular: The heart has a regular rate and rhythm with a normal S1 and S2. There were no murmurs, gallops or rubs.    Skin: Extremities are without rash or  edema.  Musculoskeletal:  Back is nontender  Neurologic Exam  Mental status: The patient is alert and oriented x 3 at the time of the examination. The patient has apparent normal recent and remote memory, with an apparently normal attention span and concentration ability.   Speech is normal.  Cranial nerves: Extraocular movements are full. Pupils are equal, round, and reactive to light and accomodation.  There is good facial sensation to soft touch bilaterally.Facial strength is normal.  Trapezius and sternocleidomastoid strength is normal. No dysarthria is noted.  The tongue is midline, and the patient has symmetric elevation of the soft palate. No obvious hearing deficits are noted.  Motor:  Muscle bulk is normal.   Tone  is normal. Strength is  5 / 5 in all 4 extremities.   Sensory: Sensory testing is intact to pinprick, soft touch and vibration sensation in the arms.  Mild reduced vibration sensatin (70% at ankles and 40% at toes) n feet.  Reduced sensation on the soles near the toes.   Coordination: Cerebellar testing reveals good finger-nose-finger and heel-to-shin bilaterally.  Gait and station: Station is normal.   Gait is normal. Tandem gait is normal. Romberg is negative.   Reflexes: Deep tendon reflexes are symmetric and normal bilaterally.   Plantar responses are flexor.    DIAGNOSTIC DATA (LABS, IMAGING, TESTING) - I reviewed patient records, labs, notes, testing and imaging myself where available.  Lab Results  Component Value Date   WBC 9.4 10/06/2022   HGB 15.8 (H) 10/06/2022   HCT 51.4 (H) 10/06/2022   MCV 90.8 10/06/2022   PLT 242.0 10/06/2022      Component Value Date/Time   NA 139 01/13/2022 1215   K 4.3 01/13/2022 1215   CL 102 01/13/2022 1215   CO2 27 01/13/2022 1215   GLUCOSE 79 01/13/2022 1215   BUN 16 01/13/2022 1215   CREATININE 0.93 01/13/2022 1215   CREATININE 0.66 12/20/2015 1645   CALCIUM 10.0 01/13/2022 1215   PROT 7.0 01/11/2022 0929   ALBUMIN 4.9 01/11/2022 0929   AST 17 01/11/2022 0929   ALT 9 01/11/2022 0929   ALKPHOS 67 01/11/2022 0929   BILITOT 0.8 01/11/2022 0929   GFRNONAA >60 01/11/2022 0929   GFRAA >60 04/13/2017 1800   No results found for: "CHOL", "HDL", "LDLCALC", "LDLDIRECT", "TRIG", "CHOLHDL" No results found for: "HGBA1C" Lab Results  Component Value Date   VITAMINB12 362 10/06/2022   Lab Results  Component Value Date   TSH 3.08 10/06/2022       ASSESSMENT AND PLAN  Polyneuropathy - Plan: Vitamin B12, Sjogren's syndrome antibods(ssa + ssb), Multiple Myeloma Panel (SPEP&IFE w/QIG), NCV with EMG(electromyography)  Dysesthesia - Plan: Vitamin B12, Sjogren's syndrome antibods(ssa + ssb), Multiple Myeloma Panel (SPEP&IFE w/QIG), NCV  with EMG(electromyography)   In summary, the patient is a 22 year old with a long history of dysesthetic  sensations in the feet (>7 years) that have progressed over the last couple of years.  We discussed to help with the symptoms Lyrica should be taken 50 mg twice a day.  The dose can be increased to 50 mg in the morning and 100 mg at night if tolerated.  We will check an NCV/EMG study to evaluate for polyneuropathy.  Additional lab work for B12, SSA/SSB and SPEP/IEF will be done.  A follow-up was not scheduled but he is advised to call if there are new or worsening neurologic symptoms or we will schedule additional testing or treatment based on the results of the study.  Thank you for asking me to see Mr. Hemric.  Please let me know if I can be of further assistance with him or other patients in the future       Lawrnce Reyez A. Epimenio Foot, MD, [facility] 01/28/2023, 10:54 AM Certified in Neurology, Clinical Neurophysiology, Sleep Medicine and Neuroimaging  [facility] [address], [address] [place], [place] [address] [phone]

## 2023-02-01 ENCOUNTER — Other Ambulatory Visit (HOSPITAL_BASED_OUTPATIENT_CLINIC_OR_DEPARTMENT_OTHER): Payer: Self-pay

## 2023-02-01 ENCOUNTER — Encounter (HOSPITAL_BASED_OUTPATIENT_CLINIC_OR_DEPARTMENT_OTHER): Payer: Self-pay

## 2023-02-02 LAB — MULTIPLE MYELOMA PANEL, SERUM
Albumin SerPl Elph-Mcnc: 4.5 g/dL — ABNORMAL HIGH (ref 2.9–4.4)
Albumin/Glob SerPl: 1.7 (ref 0.7–1.7)
Alpha 1: 0.2 g/dL (ref 0.0–0.4)
Alpha2 Glob SerPl Elph-Mcnc: 0.6 g/dL (ref 0.4–1.0)
B-Globulin SerPl Elph-Mcnc: 0.8 g/dL (ref 0.7–1.3)
Gamma Glob SerPl Elph-Mcnc: 1.1 g/dL (ref 0.4–1.8)
Globulin, Total: 2.7 g/dL (ref 2.2–3.9)
IgA/Immunoglobulin A, Serum: 75 mg/dL — ABNORMAL LOW (ref 87–352)
IgG (Immunoglobin G), Serum: 1057 mg/dL (ref 586–1602)
IgM (Immunoglobulin M), Srm: 130 mg/dL (ref 26–217)
Total Protein: 7.2 g/dL (ref 6.0–8.5)

## 2023-02-02 LAB — SJOGREN'S SYNDROME ANTIBODS(SSA + SSB)
ENA SSA (RO) Ab: <0.2 AI (ref 0.0–0.9)
ENA SSB (LA) Ab: <0.2 AI (ref 0.0–0.9)

## 2023-02-02 LAB — VITAMIN B12: Vitamin B-12: 465 pg/mL (ref 232–1245)

## 2023-02-03 ENCOUNTER — Telehealth: Payer: Self-pay | Admitting: *Deleted

## 2023-02-03 NOTE — Telephone Encounter (Signed)
   Letter was provided by Dr.Sater for patient on 01/28/23.

## 2023-02-10 ENCOUNTER — Emergency Department (HOSPITAL_BASED_OUTPATIENT_CLINIC_OR_DEPARTMENT_OTHER)
Admission: EM | Admit: 2023-02-10 | Discharge: 2023-02-10 | Disposition: A | Discharge status: Home / Self Care | Payer: 59 | Attending: Emergency Medicine | Admitting: Emergency Medicine

## 2023-02-10 ENCOUNTER — Encounter (HOSPITAL_BASED_OUTPATIENT_CLINIC_OR_DEPARTMENT_OTHER): Payer: Self-pay | Admitting: Emergency Medicine

## 2023-02-10 ENCOUNTER — Other Ambulatory Visit: Payer: Self-pay

## 2023-02-10 ENCOUNTER — Other Ambulatory Visit (HOSPITAL_BASED_OUTPATIENT_CLINIC_OR_DEPARTMENT_OTHER): Payer: Self-pay

## 2023-02-10 DIAGNOSIS — K5900 Constipation, unspecified: Secondary | ICD-10-CM | POA: Diagnosis not present

## 2023-02-10 DIAGNOSIS — R1030 Lower abdominal pain, unspecified: Secondary | ICD-10-CM | POA: Diagnosis present

## 2023-02-10 LAB — URINALYSIS, ROUTINE W REFLEX MICROSCOPIC
Bilirubin Urine: NEGATIVE
Glucose, UA: NEGATIVE mg/dL
Hgb urine dipstick: NEGATIVE
Ketones, ur: NEGATIVE mg/dL
Nitrite: NEGATIVE
Protein, ur: NEGATIVE mg/dL
Specific Gravity, Urine: 1.017 (ref 1.005–1.030)
pH: 7.5 (ref 5.0–8.0)

## 2023-02-10 LAB — PREGNANCY, URINE: Preg Test, Ur: NEGATIVE

## 2023-02-10 LAB — OCCULT BLOOD X 1 CARD TO LAB, STOOL: Fecal Occult Bld: NEGATIVE

## 2023-02-10 MED ORDER — DICYCLOMINE HCL 20 MG PO TABS
20.0000 mg | ORAL_TABLET | Freq: Two times a day (BID) | ORAL | 0 refills | Status: DC
  Filled 2023-02-10: qty 20, 10d supply, fill #0

## 2023-02-10 NOTE — Discharge Instructions (Addendum)
You have symptoms suggestive of irritable bowel syndrome.  Take Bentyl as needed as it may help with your symptoms.  Please follow-up with GI specialist as scheduled for further evaluation.  Return if you have any concern.

## 2023-02-10 NOTE — ED Provider Notes (Signed)
 Alicia Li Provider Note   CSN: [id number] Arrival date & time: 02/10/23  1000     History  No chief complaint on file.   Alicia Li is a 22 y.o. adult.  The history is provided by the patient and medical records. No language interpreter was used.     22 year old transgender female significant history of Lynch syndrome presenting with complaints of constipation.  Patient states for the past 3 months patient felt a sense of incompleteness with having bowel movement.  States he is able to have a small bowel movement.  Will immediately few the urge to have another bowel movement.  It comes and goes but has been more noticeable in the past few days.  Occasionally noted some mild nausea but no vomiting and endorse some mild lower abdominal cramping.  No fever chills night sweats rectal bleeding or having hard stool.  Diet is about the same.  Patient does have an appoint with a GI specialist in the near future and states that he had a colonoscopy done last year that was normal.  Home Medications Prior to Admission medications   Medication Sig Start Date End Date Taking? Authorizing Provider  clindamycin (CLINDAGEL) 1 % gel  06/02/17   [provider]  pregabalin (LYRICA) 50 MG capsule Take 1 capsule (50 mg total) by mouth 3 (three) times daily. 10/06/22   Etta Grandchild, MD  sulfamethoxazole-trimethoprim (BACTRIM DS) 800-160 MG tablet Take 1 tablet by mouth every 12 (twelve) hours for 5 days as directed 01/13/23     testosterone cypionate (DEPOTESTOSTERONE CYPIONATE) 200 MG/ML injection Inject 0.5 mLs (100 mg total) into the muscle every 14 (fourteen) days. 10/07/22   Etta Grandchild, MD  testosterone cypionate (DEPOTESTOSTERONE CYPIONATE) 200 MG/ML injection Inject 0.5 mLs (100 mg total) into the skin every 7 (seven) days. 11/02/22     tretinoin (RETIN-A) 0.025 % gel  06/02/17   [provider]      Allergies    Patient has no  known allergies.    Review of Systems   Review of Systems  All other systems reviewed and are negative.   Physical Exam Updated Vital Signs There were no vitals taken for this visit. Physical Exam Vitals and nursing note reviewed.  Constitutional:      General: He is not in acute distress.    Appearance: He is well-developed.  HENT:     Head: Atraumatic.  Eyes:     Conjunctiva/sclera: Conjunctivae normal.  Cardiovascular:     Rate and Rhythm: Normal rate and regular rhythm.     Pulses: Normal pulses.     Heart sounds: Normal heart sounds.  Pulmonary:     Effort: Pulmonary effort is normal.     Breath sounds: Normal breath sounds. No wheezing, rhonchi or rales.  Abdominal:     Palpations: Abdomen is soft.     Tenderness: There is no abdominal tenderness.     Hernia: No hernia is present.  Genitourinary:    Comments: Chaperone present during exam.  Normal rectal tone no obvious mass no gross blood on glove and no thrombosed hemorrhoid. Musculoskeletal:     Cervical back: Neck supple.  Skin:    Findings: No rash.  Neurological:     Mental Status: He is alert.     ED Results / Procedures / Treatments   Labs (all labs ordered are listed, but only abnormal results are displayed) Labs Reviewed  CBC WITH DIFFERENTIAL/PLATELET  COMPREHENSIVE  METABOLIC PANEL  LIPASE, BLOOD  URINALYSIS, ROUTINE W REFLEX MICROSCOPIC  PREGNANCY, URINE    EKG None  Radiology No results found.  Procedures Procedures    Medications Ordered in ED Medications - No data to display  ED Course/ Medical Decision Making/ A&P                                 Medical Decision Making Amount and/or Complexity of Data Reviewed Labs: ordered.   BP 120/77 (BP Location: Right Arm)   Pulse 84   Temp 98.4 F (36.9 C) (Oral)   Resp 18   Ht 5\' 3"  (1.6 m)   Wt 59 kg   SpO2 100%   BMI 23.03 kg/m   43:69 AM 22 year old transgender female significant history of Lynch syndrome presenting  with complaints of constipation.  Patient states for the past 3 months patient felt a sense of incompleteness with having bowel movement.  States he is able to have a small bowel movement.  Will immediately few the urge to have another bowel movement.  It comes and goes but has been more noticeable in the past few days.  Occasionally noted some mild nausea but no vomiting and endorse some mild lower abdominal cramping.  No fever chills night sweats rectal bleeding or having hard stool.  Diet is about the same.  Patient does have an appoint with a GI specialist in the near future and states that he had a colonoscopy done last year that was normal.  Exam overall reassuring heart with normal rate and rhythm, lungs clear to auscultation bilaterally abdomen is soft nontender with normal bowel sounds.  Rectal exam with chaperone present without any concerning finding.  Normal color stool on glove and no obvious palpable mass or impacted stool.  Labs and imaging considered but not performed as patient has a fairly benign abdominal exam and his symptom has been an ongoing issue for the past 3 months which makes infectious etiology less likely.  He does have an appointment to be seen by GI specialist in less than a week which I felt is appropriate.  Do not think CT scan of abdomen pelvis is going to yield any results during today's visit.  Patient agrees.  Patient discharged home with Bentyl to use as needed as it would likely help with his symptoms.  Return precaution given.  Social determinant of health including tobacco use.  Li admission considered but I felt patient is stable for discharge home.  Suspect irritable bowel syndrome as the leading cause.  Doubt malignancy causing constipation.        Final Clinical Impression(s) / ED Diagnoses Final diagnoses:  Constipation, unspecified constipation type    Rx / DC Orders ED Discharge Orders          Ordered    dicyclomine (BENTYL) 20 MG tablet   2 times daily        02/10/23 1158              Fayrene Helper, PA-C 02/10/23 1200    Margarita Grizzle, MD 02/11/23 1153

## 2023-02-10 NOTE — ED Notes (Signed)
Reviewed AVS/discharge instruction with patient. Time allotted for and all questions answered. Patient is agreeable for d/c and escorted to ed exit by staff.  

## 2023-02-10 NOTE — ED Triage Notes (Signed)
Pt caox4, ambulatory, NAD reporting that over the past 2 mo has been experiencing constipation issues with feeling like any time there is a BM there is incomplete emptying. Has appt scheduled with GI for same. Came in today d/t feeling like s/s worsened yesterday with nausea and lower abd cramping. Denies abd cramping and nausea at present, states it comes intermittently.

## 2023-02-16 ENCOUNTER — Ambulatory Visit: Payer: 59 | Admitting: Gastroenterology

## 2023-02-25 ENCOUNTER — Ambulatory Visit: Payer: 59 | Admitting: Nurse Practitioner

## 2023-02-25 NOTE — Progress Notes (Deleted)
 ASSESSMENT    Brief Narrative:  22 y.o.  adult known to Dr. Marland Kitchen  with a past medical history not limited to ***  PMS2-related Lynch Syndrome   See PMH below for additional history  PLAN         HPI   Chief complaint :  Patient was seen here in Oct 2023 for bowel habit changes, blood in stool and a family history of Lynch syndrome        Procedure risk assessment:  No history of CHF.  No supplemental 02 use at home.  Not a known difficult airway Anticoagulant:     GI History / Pertinent GI Studies    **All endoscopic studies may not be included here    Colonoscopy Nov 2023 --Non-bleeding internal hemorrhoids. Entire colon was normal. Biopsied.  Path: benign colonic mucosa.      Latest Ref Rng & Units 01/28/2023   10:51 AM 01/11/2022    9:29 AM 08/01/2020    2:54 PM  Hepatic Function  Total Protein 6.0 - 8.5 g/dL 7.2  7.0  7.5   Albumin 3.5 - 5.0 g/dL  4.9  4.6   AST 15 - 41 U/L  17  20   ALT 0 - 44 U/L  9  15   Alk Phosphatase 38 - 126 U/L  67  55   Total Bilirubin 0.3 - 1.2 mg/dL  0.8  0.7   Bilirubin, Direct 0.0 - 0.3 mg/dL   0.1        Latest Ref Rng & Units 10/06/2022    2:01 PM 01/11/2022    9:29 AM 08/01/2020    2:54 PM  CBC  WBC 4.0 - 10.5 K/uL 9.4  12.5  7.8   Hemoglobin 12.0 - 15.0 g/dL 69.6  29.5  28.4   Hematocrit 36.0 - 46.0 % 51.4  43.6  40.6   Platelets 150.0 - 400.0 K/uL 242.0  250  274.0      Past Medical History:  Diagnosis Date   Family history of colon cancer    Family history of Lynch syndrome    Hemorrhoids    Lynch syndrome     Past Surgical History:  Procedure Laterality Date   TONSILLECTOMY      Family History  Problem Relation Age of Onset   Colon polyps Mother    Other Mother        Lynch syndrome - PMS2 pos   Diabetes Father    Other Maternal Grandmother        Lynch syndrome   Colon cancer Maternal Grandfather 55   Heart disease Paternal Grandfather    Diabetes Paternal Grandfather    Other  Maternal Aunt        PMS2 neg   Colon cancer Other 23       MGFs brother   Colon cancer Other 33       MGF's brother   Colon cancer Other 55       MGFs sister - MLH1 pos   Cervical cancer Paternal Great-grandmother     Current Medications, Allergies, Family History and Social History were reviewed in [employer] electronic medical record.     Current Outpatient Medications  Medication Sig Dispense Refill   clindamycin (CLINDAGEL) 1 % gel      dicyclomine (BENTYL) 20 MG tablet Take 1 tablet (20 mg total) by mouth 2 (two) times daily. 20 tablet 0   pregabalin (LYRICA) 50 MG capsule Take 1 capsule (50  mg total) by mouth 3 (three) times daily. 270 capsule 0   sulfamethoxazole-trimethoprim (BACTRIM DS) 800-160 MG tablet Take 1 tablet by mouth every 12 (twelve) hours for 5 days as directed 10 tablet 0   testosterone cypionate (DEPOTESTOSTERONE CYPIONATE) 200 MG/ML injection Inject 0.5 mLs (100 mg total) into the muscle every 14 (fourteen) days. 10 mL 0   testosterone cypionate (DEPOTESTOSTERONE CYPIONATE) 200 MG/ML injection Inject 0.5 mLs (100 mg total) into the skin every 7 (seven) days. 6 mL 0   tretinoin (RETIN-A) 0.025 % gel      No current facility-administered medications for this visit.    Review of Systems: No chest pain. No shortness of breath. No urinary complaints.    Physical Exam  There were no vitals filed for this visit. Wt Readings from Last 3 Encounters:  02/10/23 130 lb (59 kg)  01/28/23 132 lb 8 oz (60.1 kg)  10/06/22 128 lb (58.1 kg)    There were no vitals taken for this visit. Constitutional:  Pleasant, generally well appearing ***female in no acute distress. Psychiatric: Normal mood and affect. Behavior is normal. EENT: Pupils normal.  Conjunctivae are normal. No scleral icterus. Neck supple.  Cardiovascular: Normal rate, regular rhythm.  Pulmonary/chest: Effort normal and breath sounds normal. No wheezing, rales or rhonchi. Abdominal: Soft,  nondistended, nontender. Bowel sounds active throughout. There are no masses palpable. No hepatomegaly. Neurological: Alert and oriented to person place and time.  Extremities: *** edema  Willette Cluster, NP  02/25/2023, 8:41 AM  Cc:  Etta Grandchild, MD

## 2023-03-16 ENCOUNTER — Other Ambulatory Visit: Payer: Self-pay

## 2023-03-16 ENCOUNTER — Ambulatory Visit (INDEPENDENT_AMBULATORY_CARE_PROVIDER_SITE_OTHER): Payer: 59 | Admitting: Neurology

## 2023-03-16 ENCOUNTER — Ambulatory Visit (INDEPENDENT_AMBULATORY_CARE_PROVIDER_SITE_OTHER): Payer: Self-pay | Admitting: Neurology

## 2023-03-16 DIAGNOSIS — G629 Polyneuropathy, unspecified: Secondary | ICD-10-CM

## 2023-03-16 DIAGNOSIS — Z0289 Encounter for other administrative examinations: Secondary | ICD-10-CM

## 2023-03-16 DIAGNOSIS — R7309 Other abnormal glucose: Secondary | ICD-10-CM

## 2023-03-16 DIAGNOSIS — K582 Mixed irritable bowel syndrome: Secondary | ICD-10-CM

## 2023-03-16 DIAGNOSIS — R208 Other disturbances of skin sensation: Secondary | ICD-10-CM

## 2023-03-16 DIAGNOSIS — G5601 Carpal tunnel syndrome, right upper limb: Secondary | ICD-10-CM

## 2023-03-16 DIAGNOSIS — D8989 Other specified disorders involving the immune mechanism, not elsewhere classified: Secondary | ICD-10-CM

## 2023-03-16 NOTE — Patient Instructions (Addendum)
Small fiber neuropathy? Will discuss with Dr. Lyla Son [id number] and several other labs [facility] 80-panel genetic testing bc started so young discussed pitfalls of genetic testing, talk to family about family hostory Consider skin biopsy for small fiber confirmation   Peripheral Neuropathy Peripheral neuropathy is a type of nerve damage. It affects nerves that carry signals between the spinal cord and the arms, legs, and the rest of the body (peripheral nerves). It does not affect nerves in the spinal cord or brain. In peripheral neuropathy, one nerve or a group of nerves may be damaged. Peripheral neuropathy is a broad category that includes many specific nerve disorders, like diabetic neuropathy, hereditary neuropathy, and carpal tunnel syndrome. What are the causes? This condition may be caused by: Certain diseases, such as: Diabetes. This is the most common cause of peripheral neuropathy. Autoimmune diseases, such as rheumatoid arthritis and systemic lupus erythematosus. Nerve diseases that are passed from parent to child (inherited). Kidney disease. Thyroid disease. Other causes may include: Nerve injury. Pressure or stress on a nerve that lasts a long time. Lack (deficiency) of B vitamins. This can result from alcoholism, poor diet, or a restricted diet. Infections. Some medicines, such as cancer medicines (chemotherapy). Poisonous (toxic) substances, such as lead and mercury. Too little blood flowing to the legs. In some cases, the cause of this condition is not known. What are the signs or symptoms? Symptoms of this condition depend on which of your nerves is damaged. Symptoms in the legs, hands, and arms can include: Loss of feeling (numbness) in the feet, hands, or both. Tingling in the feet, hands, or both. Burning pain. Very sensitive skin. Weakness. Not being able to move a part of the body (paralysis). Clumsiness or poor coordination. Muscle twitching. Loss of  balance. Symptoms in other parts of the body can include: Not being able to control your bladder. Feeling dizzy. Sexual problems. How is this diagnosed? Diagnosing and finding the cause of peripheral neuropathy can be difficult. Your health care provider will take your medical history and do a physical exam. A neurological exam will also be done. This involves checking things that are affected by your brain, spinal cord, and nerves (nervous system). For example, your health care provider will check your reflexes, how you move, and what you can feel. You may have other tests, such as: Blood tests. Electromyogram (EMG) and nerve conduction tests. These tests check nerve function and how well the nerves are controlling the muscles. Imaging tests, such as a CT scan or MRI, to rule out other causes of your symptoms. Removing a small piece of nerve to be examined in a lab (nerve biopsy). Removing and examining a small amount of the fluid that surrounds the brain and spinal cord (lumbar puncture). How is this treated? Treatment for this condition may involve: Treating the underlying cause of the neuropathy, such as diabetes, kidney disease, or vitamin deficiencies. Stopping medicines that can cause neuropathy, such as chemotherapy. Medicine to help relieve pain. Medicines may include: Prescription or over-the-counter pain medicine. Anti-seizure medicine. Antidepressants. Pain-relieving patches that are applied to painful areas of skin. Surgery to relieve pressure on a nerve or to destroy a nerve that is causing pain. Physical therapy to help improve movement and balance. Devices to help you move around (assistive devices). Follow these instructions at home: Medicines Take over-the-counter and prescription medicines only as told by your health care provider. Do not take any other medicines without first asking your health care provider. Ask your health  care provider if the medicine prescribed to  you requires you to avoid driving or using machinery. Lifestyle  Do not use any products that contain nicotine or tobacco. These products include cigarettes, chewing tobacco, and vaping devices, such as e-cigarettes. Smoking keeps blood from reaching damaged nerves. If you need help quitting, ask your health care provider. Avoid or limit alcohol. Too much alcohol can cause a vitamin B deficiency, and vitamin B is needed for healthy nerves. Eat a healthy diet. This includes: Eating foods that are high in fiber, such as beans, whole grains, and fresh fruits and vegetables. Limiting foods that are high in fat and processed sugars, such as fried or sweet foods. General instructions  If you have diabetes, work closely with your health care provider to keep your blood sugar under control. If you have numbness in your feet: Check every day for signs of injury or infection. Watch for redness, warmth, and swelling. Wear padded socks and comfortable shoes. These help protect your feet. Develop a good support system. Living with peripheral neuropathy can be stressful. Consider talking with a mental health specialist or joining a support group. Use assistive devices and attend physical therapy as told by your health care provider. This may include using a walker or a cane. Keep all follow-up visits. This is important. Where to find more information [employer] of Neurological Disorders: [employer] Contact a health care provider if: You have new signs or symptoms of peripheral neuropathy. You are struggling emotionally from dealing with peripheral neuropathy. Your pain is not well controlled. Get help right away if: You have an injury or infection that is not healing normally. You develop new weakness in an arm or leg. You have fallen or do so frequently. Summary Peripheral neuropathy is when the nerves in the arms or legs are damaged, resulting in numbness, weakness, or pain. There are  many causes of peripheral neuropathy, including diabetes, pinched nerves, vitamin deficiencies, autoimmune disease, and hereditary conditions. Diagnosing and finding the cause of peripheral neuropathy can be difficult. Your health care provider will take your medical history, do a physical exam, and do tests, including blood tests and nerve function tests. Treatment involves treating the underlying cause of the neuropathy and taking medicines to help control pain. Physical therapy and assistive devices may also help. This information is not intended to replace advice given to you by your health care provider. Make sure you discuss any questions you have with your health care provider. Document Revised: 11/12/2020 Document Reviewed: 11/12/2020 Elsevier Patient Education  2024 [employer] Inc.  Amyloidosis can cause small fiber neuropathy (SFN), a condition that affects the smallest nerve fibers in the body:  Early symptoms Amyloid neuropathy often begins with small fiber involvement, causing a painful, distal, and symmetrical neuropathy. Symptoms include burning, tingling, or "pins and needles" sensations in the hands and feet.  Progression Over time, the disease can progress to involve larger nerve fibers, causing motor deficits and sensory loss.  Other symptoms Amyloid neuropathy can also cause autonomic dysfunction, weight loss, and other systemic features.  Types of amyloidosis There are different types of amyloidosis, including AL amyloidosis and transthyretin-mediated amyloidosis (ATTR amyloidosis):  AL amyloidosis: Peripheral neuropathy is a common manifestation, and can be accompanied by carpal tunnel syndrome.  ATTR amyloidosis: Treatments include gene silencers like Amvuttra and Onpattro, as well as novel gene therapy and transthyretin stabilizers

## 2023-03-16 NOTE — Progress Notes (Signed)
Full Name: Alicia Li Gender: Female MRN #: [id number] Date of Birth: Mar 21, 2001    Visit Date: 03/16/2023 08:22 Age: 22 Years Examining Physician: Dr. Naomie Dean Referring Physician: Dr. Despina Arias Height: 5 feet 3 inch    History: No patient is here for numbness in his toes worse on the right.  He also complained of sensory issues in the right digits 1 through 3 therefore nerve conduction studies were performed on the right upper and right lower extremities. He denies low back pain or radicular symptoms and low back not associated with his symptoms in the feet. He declines emg testing of more proximal muscles of the leg or the right arm as he doesn't think he can tolerate more needle study.   Summary: NCS performed on the right upper and bilateral lower extremities. Limited EMG on the right lower extremity and emg not performed on the right upper extremity as patient was unable to tolerate further needle exam.   Summary: The right Median 2nd Digit orthodromic sensory nerve showed prolonged distal peak latency (3.8 ms, N<3.4). The right median/ulnar (palm) comparison nerve showed prolonged distal peak latency (Median Palm, 2.9 ms, N<2.2) and abnormal peak latency difference (Median Palm-Ulnar Palm, 0.5 ms, N<0.4) with a relative median delay.  All remaining nerves (as indicated in the following tables) were within normal limits.  All muscles (as indicated in the following tables) were within normal limits.      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   I reviewed this finding with patient: he has mild carpal tunnel syndrome, his symptoms are also mild as well and he declined intervention at this time. Discussed conservative measures at this point but if it progresses in severity frequency, or he develops new symptoms such as weakness then he needs to inform his primary neurologist or primary care for referral to orthopedics or hand center; as discussed with  patient, untreated carpal tunnel syndrome can lead to sequelae such as permanent weakness, atrophy and sensory loss.  No suggestion of polyneuropathy or radiculopathy of the right upper extremity.  Nerve conduction studies of the right lower extremity were normal.  No suggestion of mononeuropathy, polyneuropathy or radiculopathy of the right lower extremity. Limited EMG on the right lower extremity was normal. Only limited Emg needle study on the right lower extremity and no EMG of the right upper extremity performed as patient was unable to tolerate further needle exam.     ------------------------------- Naomie Dean, M.D.  [facility] [address], [address] [place], [place] [address] Tel: [phone] Fax: [phone]  Verbal informed consent was obtained from the patient, patient was informed of potential risk of procedure, including bruising, bleeding, hematoma formation, infection, muscle weakness, muscle pain, numbness, among others.        MNC    Nerve / Sites Muscle Latency Ref. Amplitude Ref. Rel Amp Segments Distance Velocity Ref. Area    ms ms mV mV %  cm m/s m/s mVms  R Median - APB     Wrist APB 4.3 <=4.4 6.1 >=4.0 100 Wrist - APB 7   24.9     Upper arm APB 8.6  6.7  109 Upper arm - Wrist 23 54 >=49 33.7  R Ulnar - ADM     Wrist ADM 3.2 <=3.3 13.7 >=6.0 100 Wrist - ADM 7   47.6     B.Elbow ADM 5.2  13.1  96.1 B.Elbow - Wrist 12 60 >=49  47.8     A.Elbow ADM 7.3  11.2  85.3 A.Elbow - B.Elbow 13 64 >=49 40.0  R Peroneal - EDB     Ankle EDB 4.9 <=6.5 8.9 >=2.0 100 Ankle - EDB 9   21.3     Fib head EDB 10.5  8.1  91.5 Fib head - Ankle 29 52 >=44 20.2     Pop fossa EDB 12.1  7.9  97.1 Pop fossa - Fib head 9 54 >=44 22.5         Pop fossa - Ankle      L Peroneal - EDB     Ankle EDB 5.1 <=6.5 6.8 >=2.0 100 Ankle - EDB 9   18.2     Fib head EDB 10.4  5.7  84.5 Fib head - Ankle 28 54 >=44 16.2     Pop fossa EDB 12.0  6.0  105 Pop fossa - Fib head 9 57 >=44  17.6         Pop fossa - Ankle      R Tibial - AH     Ankle AH 4.3 <=5.8 12.5 >=4.0 100 Ankle - AH 9   25.7     Pop fossa AH 11.6  13.0  104 Pop fossa - Ankle 36.6 50 >=41 29.0  L Tibial - AH     Ankle AH 5.1 <=5.8 10.4 >=4.0 100 Ankle - AH 9   21.2     Pop fossa AH 12.3  17.7  170 Pop fossa - Ankle 37 51 >=41 41.7                 SNC    Nerve / Sites Rec. Site Peak Lat Ref.  Amp Ref. Segments Distance Peak Diff Ref.    ms ms V V  cm ms ms  R Sural - Ankle (Calf)     Calf Ankle 3.3 <=4.4 18 >=6 Calf - Ankle 14    L Sural - Ankle (Calf)     Calf Ankle 3.9 <=4.4 20 >=6 Calf - Ankle 14    R Superficial peroneal - Ankle     Lat leg Ankle 3.9 <=4.4 17 >=6 Lat leg - Ankle 14    L Superficial peroneal - Ankle     Lat leg Ankle 3.6 <=4.4 12 >=6 Lat leg - Ankle 14    R Median, Ulnar - Transcarpal comparison     Median Palm Wrist 2.9 <=2.2 45 >=35 Median Palm - Wrist 8       Ulnar Palm Wrist 2.4 <=2.2 52 >=12 Ulnar Palm - Wrist 8          Median Palm - Ulnar Palm  0.5 <=0.4  R Median - Orthodromic (Dig II, Mid palm)     Dig II Wrist 3.8 <=3.4 24 >=10 Dig II - Wrist 13    R Ulnar - Orthodromic, (Dig V, Mid palm)     Dig V Wrist 3.2 <=3.1 13 >=5 Dig V - Wrist 7                     F  Wave    Nerve F Lat Ref.   ms ms  R Tibial - AH 43.3 <=56.0  L Tibial - AH 43.7 <=56.0  R Ulnar - ADM 25.4 <=32.0           EMG Summary Table    Spontaneous MUAP Recruitment  Muscle IA Fib PSW Fasc Other Amp Dur. Poly Pattern  R. Vastus medialis Normal None None None _______ Normal Normal Normal Normal  R. Tibialis anterior Normal None None None _______ Normal Normal Normal Normal  R. Gastrocnemius (Medial head) Normal None None None _______ Normal Normal Normal Normal  R. Extensor hallucis longus Normal None None None _______ Normal Normal Normal Normal  R. Abductor hallucis Normal None None None _______ Normal Normal Normal Normal

## 2023-03-19 NOTE — Procedures (Signed)
Full Name: Alicia Li Gender: Female MRN #: [id number] Date of Birth: 24-Feb-2001    Visit Date: 03/16/2023 08:22 Age: 22 Years Examining Physician: Dr. Naomie Dean Referring Physician: Dr. Despina Arias Height: 5 feet 3 inch    History: No patient is here for numbness in his toes worse on the right.  He also complained of sensory issues in the right digits 1 through 3 therefore nerve conduction studies were performed on the right upper and right lower extremities. He denies low back pain or radicular symptoms and low back not associated with his symptoms in the feet. He declines emg testing of more proximal muscles of the leg or the right arm as he doesn't think he can tolerate more needle study.   Summary: NCS performed on the right upper and bilateral lower extremities. Limited EMG on the right lower extremity and emg not performed on the right upper extremity as patient was unable to tolerate further needle exam. The right Median 2nd Digit orthodromic sensory nerve showed prolonged distal peak latency (3.8 ms, N<3.4). The right median/ulnar (palm) comparison nerve showed prolonged distal peak latency (Median Palm, 2.9 ms, N<2.2) and abnormal peak latency difference (Median Palm-Ulnar Palm, 0.5 ms, N<0.4) with a relative median delay.  All remaining nerves (as indicated in the following tables) were within normal limits.  All muscles (as indicated in the following tables) were within normal limits.      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   I reviewed this finding with patient: he has mild carpal tunnel syndrome, his symptoms are also mild as well and he declined intervention at this time. Discussed conservative measures at this point but if it progresses in severity frequency, or he develops new symptoms such as weakness then he needs to inform his primary neurologist or primary care for referral to orthopedics or hand center; as discussed with patient,  untreated carpal tunnel syndrome can lead to sequelae such as permanent weakness, atrophy and sensory loss.  No suggestion of polyneuropathy or radiculopathy of the right upper extremity.  Nerve conduction studies of the right lower extremity were normal.  No suggestion of mononeuropathy, polyneuropathy or radiculopathy of the right lower extremity. Limited EMG on the right lower extremity was normal. Only limited Emg needle study on the right lower extremity and no EMG of the right upper extremity performed as patient was unable to tolerate further needle exam.     ------------------------------- Naomie Dean, M.D.  [facility] [address] [place], [place] [address] Tel: [phone] Fax: [phone]  Verbal informed consent was obtained from the patient, patient was informed of potential risk of procedure, including bruising, bleeding, hematoma formation, infection, muscle weakness, muscle pain, numbness, among others.        MNC    Nerve / Sites Muscle Latency Ref. Amplitude Ref. Rel Amp Segments Distance Velocity Ref. Area    ms ms mV mV %  cm m/s m/s mVms  R Median - APB     Wrist APB 4.3 <=4.4 6.1 >=4.0 100 Wrist - APB 7   24.9     Upper arm APB 8.6  6.7  109 Upper arm - Wrist 23 54 >=49 33.7  R Ulnar - ADM     Wrist ADM 3.2 <=3.3 13.7 >=6.0 100 Wrist - ADM 7   47.6     B.Elbow ADM 5.2  13.1  96.1 B.Elbow - Wrist 12 60 >=49 47.8  A.Elbow ADM 7.3  11.2  85.3 A.Elbow - B.Elbow 13 64 >=49 40.0  R Peroneal - EDB     Ankle EDB 4.9 <=6.5 8.9 >=2.0 100 Ankle - EDB 9   21.3     Fib head EDB 10.5  8.1  91.5 Fib head - Ankle 29 52 >=44 20.2     Pop fossa EDB 12.1  7.9  97.1 Pop fossa - Fib head 9 54 >=44 22.5         Pop fossa - Ankle      L Peroneal - EDB     Ankle EDB 5.1 <=6.5 6.8 >=2.0 100 Ankle - EDB 9   18.2     Fib head EDB 10.4  5.7  84.5 Fib head - Ankle 28 54 >=44 16.2     Pop fossa EDB 12.0  6.0  105 Pop fossa - Fib head 9 57 >=44 17.6          Pop fossa - Ankle      R Tibial - AH     Ankle AH 4.3 <=5.8 12.5 >=4.0 100 Ankle - AH 9   25.7     Pop fossa AH 11.6  13.0  104 Pop fossa - Ankle 36.6 50 >=41 29.0  L Tibial - AH     Ankle AH 5.1 <=5.8 10.4 >=4.0 100 Ankle - AH 9   21.2     Pop fossa AH 12.3  17.7  170 Pop fossa - Ankle 37 51 >=41 41.7                 SNC    Nerve / Sites Rec. Site Peak Lat Ref.  Amp Ref. Segments Distance Peak Diff Ref.    ms ms V V  cm ms ms  R Sural - Ankle (Calf)     Calf Ankle 3.3 <=4.4 18 >=6 Calf - Ankle 14    L Sural - Ankle (Calf)     Calf Ankle 3.9 <=4.4 20 >=6 Calf - Ankle 14    R Superficial peroneal - Ankle     Lat leg Ankle 3.9 <=4.4 17 >=6 Lat leg - Ankle 14    L Superficial peroneal - Ankle     Lat leg Ankle 3.6 <=4.4 12 >=6 Lat leg - Ankle 14    R Median, Ulnar - Transcarpal comparison     Median Palm Wrist 2.9 <=2.2 45 >=35 Median Palm - Wrist 8       Ulnar Palm Wrist 2.4 <=2.2 52 >=12 Ulnar Palm - Wrist 8          Median Palm - Ulnar Palm  0.5 <=0.4  R Median - Orthodromic (Dig II, Mid palm)     Dig II Wrist 3.8 <=3.4 24 >=10 Dig II - Wrist 13    R Ulnar - Orthodromic, (Dig V, Mid palm)     Dig V Wrist 3.2 <=3.1 13 >=5 Dig V - Wrist 79                     F  Wave    Nerve F Lat Ref.   ms ms  R Tibial - AH 43.3 <=56.0  L Tibial - AH 43.7 <=56.0  R Ulnar - ADM 25.4 <=32.0           EMG Summary Table    Spontaneous MUAP Recruitment  Muscle IA Fib PSW Fasc Other Amp Dur. Poly Pattern  R. Vastus medialis Normal  None None None _______ Normal Normal Normal Normal  R. Tibialis anterior Normal None None None _______ Normal Normal Normal Normal  R. Gastrocnemius (Medial head) Normal None None None _______ Normal Normal Normal Normal  R. Extensor hallucis longus Normal None None None _______ Normal Normal Normal Normal  R. Abductor hallucis Normal None None None _______ Normal Normal Normal Normal

## 2023-03-25 ENCOUNTER — Other Ambulatory Visit (INDEPENDENT_AMBULATORY_CARE_PROVIDER_SITE_OTHER): Payer: Self-pay

## 2023-03-25 DIAGNOSIS — Z0289 Encounter for other administrative examinations: Secondary | ICD-10-CM

## 2023-03-25 NOTE — Progress Notes (Signed)
 See procedure note.

## 2023-03-26 ENCOUNTER — Other Ambulatory Visit (HOSPITAL_BASED_OUTPATIENT_CLINIC_OR_DEPARTMENT_OTHER): Payer: Self-pay

## 2023-03-26 LAB — HEMOGLOBIN A1C
Est. average glucose Bld gHb Est-mCnc: 97 mg/dL
Hgb A1c MFr Bld: 5 % (ref 4.8–5.6)

## 2023-03-27 LAB — GLIADIN ANTIBODIES, SERUM
Antigliadin Abs, IgA: 3 U (ref 0–19)
Gliadin IgG: 2 U (ref 0–19)

## 2023-03-27 LAB — ANCA PROFILE
Anti-MPO Antibodies: <0.2 U (ref 0.0–0.9)
Anti-PR3 Antibodies: <0.2 U (ref 0.0–0.9)
Atypical pANCA: <1:20 {titer}
C-ANCA: <1:20 {titer}
P-ANCA: <1:20 {titer}

## 2023-03-27 LAB — TISSUE TRANSGLUTAMINASE, IGA: Transglutaminase IgA: <2 U/mL (ref 0–3)

## 2023-03-29 LAB — SICKLE CELL SCREEN: Sickle Cell Screen: NEGATIVE

## 2023-03-29 LAB — RPR: RPR Ser Ql: NONREACTIVE

## 2023-03-29 LAB — HIV ANTIBODY (ROUTINE TESTING W REFLEX): HIV Screen 4th Generation wRfx: NONREACTIVE

## 2023-03-29 LAB — COPPER, SERUM: Copper: 77 ug/dL — ABNORMAL LOW (ref 80–158)

## 2023-03-29 LAB — VITAMIN E
Vitamin E (Alpha Tocopherol): 10 mg/L (ref 5.9–19.4)
Vitamin E(Gamma Tocopherol): 1.4 mg/L (ref 0.7–4.9)

## 2023-03-29 LAB — HEAVY METALS, BLOOD
Arsenic: 2 ug/L (ref 0–9)
Lead, Blood: <1 ug/dL (ref 0.0–3.4)
Mercury: <1 ug/L (ref 0.0–14.9)

## 2023-03-29 LAB — ANGIOTENSIN CONVERTING ENZYME: Angio Convert Enzyme: 25 U/L (ref 14–82)

## 2023-03-29 LAB — HEPATITIS C ANTIBODY: Hep C Virus Ab: NONREACTIVE

## 2023-03-29 LAB — ANTINUCLEAR ANTIBODIES, IFA: ANA Titer 1: NEGATIVE

## 2023-03-29 LAB — RHEUMATOID FACTOR: Rheumatoid fact SerPl-aCnc: <10 [IU]/mL (ref <14.0)

## 2023-03-29 LAB — ZINC: Zinc: 79 ug/dL (ref 44–115)

## 2023-03-30 ENCOUNTER — Encounter: Payer: Self-pay | Admitting: Neurology

## 2023-04-02 ENCOUNTER — Ambulatory Visit: Payer: 59 | Admitting: Internal Medicine

## 2023-04-05 ENCOUNTER — Other Ambulatory Visit (HOSPITAL_BASED_OUTPATIENT_CLINIC_OR_DEPARTMENT_OTHER): Payer: Self-pay

## 2023-04-10 ENCOUNTER — Other Ambulatory Visit (HOSPITAL_BASED_OUTPATIENT_CLINIC_OR_DEPARTMENT_OTHER): Payer: Self-pay

## 2023-04-12 ENCOUNTER — Other Ambulatory Visit: Payer: Self-pay

## 2023-04-13 ENCOUNTER — Other Ambulatory Visit (HOSPITAL_BASED_OUTPATIENT_CLINIC_OR_DEPARTMENT_OTHER): Payer: Self-pay

## 2023-04-21 ENCOUNTER — Ambulatory Visit: Payer: 59 | Admitting: Internal Medicine

## 2023-04-22 ENCOUNTER — Telehealth: Payer: Self-pay | Admitting: Internal Medicine

## 2023-04-22 ENCOUNTER — Other Ambulatory Visit (HOSPITAL_BASED_OUTPATIENT_CLINIC_OR_DEPARTMENT_OTHER): Payer: Self-pay

## 2023-04-22 NOTE — Telephone Encounter (Signed)
Patient dropped off document  job requirement paperwork , to be filled out by provider. Patient requested to send it back via Fax within 7-days. Document is located in providers tray at front office.Please advise at Mobile [phone] (mobile)

## 2023-04-23 NOTE — Telephone Encounter (Signed)
Spoke with patient and advised him that I would have to wait for Dr. Yetta Barre to sign this or let me know if he can sign it. He gave a verbal understanding.

## 2023-04-23 NOTE — Telephone Encounter (Signed)
Unable to reach patient or lmtrc will try to cal patient back this afternoon.

## 2023-05-27 ENCOUNTER — Ambulatory Visit: Payer: 59 | Admitting: Internal Medicine

## 2023-06-14 ENCOUNTER — Other Ambulatory Visit (HOSPITAL_BASED_OUTPATIENT_CLINIC_OR_DEPARTMENT_OTHER): Payer: Self-pay

## 2023-06-14 MED ORDER — TESTOSTERONE CYPIONATE 200 MG/ML IM SOLN
100.0000 mg | INTRAMUSCULAR | 0 refills | Status: DC
  Filled 2023-06-14: qty 4, 28d supply, fill #0

## 2023-06-21 ENCOUNTER — Other Ambulatory Visit (HOSPITAL_BASED_OUTPATIENT_CLINIC_OR_DEPARTMENT_OTHER): Payer: Self-pay

## 2023-06-21 ENCOUNTER — Other Ambulatory Visit: Payer: Self-pay

## 2023-07-06 ENCOUNTER — Other Ambulatory Visit (HOSPITAL_BASED_OUTPATIENT_CLINIC_OR_DEPARTMENT_OTHER): Payer: Self-pay

## 2023-07-08 ENCOUNTER — Ambulatory Visit: Payer: No Typology Code available for payment source | Admitting: Gastroenterology

## 2023-07-08 NOTE — Progress Notes (Deleted)
 Chief Complaint: vomiting, diarrhea Primary GI Doctor: Previously Dr. Orvan Falconer  HPI:  Patient is a  23  year old transgender female patient, previously known to Dr. Orvan Falconer, with a family history of Lynch syndrome who presents today with complaint of vomiting, diarrhea.  01/13/2022 patient last seen in GI office by Dr. Orvan Falconer and at that time was having change in bowel habits with blood in stool.  She recommended colonoscopy to evaluate the symptoms given her high risk for polyps and cancer.  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   The patient's mother has a diagnosis of Lynch syndrome and is being screened appropriately.  She has five paternal half siblings - three sisters and two brothers.  One sister has undergone testing and was PMS2 negative. The maternal grandparents are alive.  The grandfather was diagnosed with colon cancer at 34.  He has three sisters and two brothers.  Both brothers and one sister had colon cancer, diagnosed between ages 73-51. Reportedly, one of the grandfather's siblings tested positive for an MLH1 mutation.  The maternal grandmother is alive and cancer free. ***  01/27/2022 colonoscopy Impression: - Non- bleeding internal hemorrhoids. - The entire examined colon is normal. Biopsied. - The examination was otherwise normal on direct and retroflexion views. Biopsies were benign. Recommendations:  Colonoscopy in 2 years. Start Upper endoscopy at age 34.  On 02/10/2023 patient presented to ED with complaints of constipation.  Also noted mild nausea but no vomiting and mild lower abdominal cramping.Labs and imaging considered but not performed as patient has a fairly benign abdominal exam and his symptom has been an ongoing issue for the past 3 months which makes infectious etiology less likely.  Patient discharged home with Bentyl.   Interval History  Patient admits/denies GERD Patient admits/denies dysphagia Patient admits/denies nausea,  vomiting, or weight loss  Patient admits/denies altered bowel habits Patient admits/denies abdominal pain Patient admits/denies rectal bleeding   Denies/Admits alcohol Denies/Admits smoking Denies/Admits NSAID use. Denies/Admits they are on blood thinners.  Patients last colonoscopy Patients last EGD  Patient's family history includes  Wt Readings from Last 3 Encounters:  02/10/23 130 lb (59 kg)  01/28/23 132 lb 8 oz (60.1 kg)  10/06/22 128 lb (58.1 kg)      Past Medical History:  Diagnosis Date   Family history of colon cancer    Family history of Lynch syndrome    Hemorrhoids    Lynch syndrome     Past Surgical History:  Procedure Laterality Date   TONSILLECTOMY      Current Outpatient Medications  Medication Sig Dispense Refill   clindamycin (CLINDAGEL) 1 % gel      dicyclomine (BENTYL) 20 MG tablet Take 1 tablet (20 mg total) by mouth 2 (two) times daily. 20 tablet 0   pregabalin (LYRICA) 50 MG capsule Take 1 capsule (50 mg total) by mouth 3 (three) times daily. 270 capsule 0   sulfamethoxazole-trimethoprim (BACTRIM DS) 800-160 MG tablet Take 1 tablet by mouth every 12 (twelve) hours for 5 days as directed 10 tablet 0   testosterone cypionate (DEPOTESTOSTERONE CYPIONATE) 200 MG/ML injection Inject 0.5 mLs (100 mg total) into the muscle every 14 (fourteen) days. 10 mL 0   testosterone cypionate (DEPOTESTOSTERONE CYPIONATE) 200 MG/ML injection Inject 0.5 mLs (100 mg total) into the skin once a week. 4 mL 0   tretinoin (RETIN-A) 0.025 % gel      No current facility-administered medications for this visit.    Allergies as of  07/08/2023   (No Known Allergies)    Family History  Problem Relation Age of Onset   Colon polyps Mother    Other Mother        Lynch syndrome - PMS2 pos   Diabetes Father    Other Maternal Grandmother        Lynch syndrome   Colon cancer Maternal Grandfather 28   Heart disease Paternal Grandfather    Diabetes Paternal Grandfather     Other Maternal Aunt        PMS2 neg   Colon cancer Other 60       MGFs brother   Colon cancer Other 65       MGF's brother   Colon cancer Other 58       MGFs sister - MLH1 pos   Cervical cancer Paternal Great-grandmother     Review of Systems:    Constitutional: No weight loss, fever, chills, weakness or fatigue HEENT: Eyes: No change in vision               Ears, Nose, Throat:  No change in hearing or congestion Skin: No rash or itching Cardiovascular: No chest pain, chest pressure or palpitations   Respiratory: No SOB or cough Gastrointestinal: See HPI and otherwise negative Genitourinary: No dysuria or change in urinary frequency Neurological: No headache, dizziness or syncope Musculoskeletal: No new muscle or joint pain Hematologic: No bleeding or bruising Psychiatric: No history of depression or anxiety    Physical Exam:  Vital signs: There were no vitals taken for this visit.  Constitutional:   Pleasant *** female appears to be in NAD, Well developed, Well nourished, alert and cooperative Head:  Normocephalic and atraumatic. Eyes:   PEERL, EOMI. No icterus. Conjunctiva pink. Ears:  Normal auditory acuity. Neck:  Supple Throat: Oral cavity and pharynx without inflammation, swelling or lesion.  Respiratory: Respirations even and unlabored. Lungs clear to auscultation bilaterally.   No wheezes, crackles, or rhonchi.  Cardiovascular: Normal S1, S2. Regular rate and rhythm. No peripheral edema, cyanosis or pallor.  Gastrointestinal:  Soft, nondistended, nontender. No rebound or guarding. Normal bowel sounds. No appreciable masses or hepatomegaly. Rectal:  Not performed.  Anoscopy: Msk:  Symmetrical without gross deformities. Without edema, no deformity or joint abnormality.  Neurologic:  Alert and  oriented x4;  grossly normal neurologically.  Skin:   Dry and intact without significant lesions or rashes. Psychiatric: Oriented to person, place and time. Demonstrates  good judgement and reason without abnormal affect or behaviors.  RELEVANT LABS AND IMAGING: CBC    Latest Ref Rng & Units 10/06/2022    2:01 PM 01/11/2022    9:29 AM 08/01/2020    2:54 PM  CBC  WBC 4.0 - 10.5 K/uL 9.4  12.5  7.8   Hemoglobin 12.0 - 15.0 g/dL 16.1  09.6  04.5   Hematocrit 36.0 - 46.0 % 51.4  43.6  40.6   Platelets 150.0 - 400.0 K/uL 242.0  250  274.0      CMP     Latest Ref Rng & Units 01/28/2023   10:51 AM 01/13/2022   12:15 PM 01/11/2022    9:29 AM  CMP  Glucose 70 - 99 mg/dL  79  409   BUN 6 - 23 mg/dL  16  10   Creatinine 8.11 - 1.20 mg/dL  9.14  7.82   Sodium 956 - 145 mEq/L  139  141   Potassium 3.5 - 5.1 mEq/L  4.3  3.4  Chloride 96 - 112 mEq/L  102  104   CO2 19 - 32 mEq/L  27  25   Calcium 8.4 - 10.5 mg/dL  16.1  9.6   Total Protein 6.0 - 8.5 g/dL 7.2   7.0   Total Bilirubin 0.3 - 1.2 mg/dL   0.8   Alkaline Phos 38 - 126 U/L   67   AST 15 - 41 U/L   17   ALT 0 - 44 U/L   9      Lab Results  Component Value Date   TSH 3.08 10/06/2022     Assessment: 1. ***  Plan: -Colonoscopy recall 01/2024 -Start Upper endoscopy at age 29   Thank you for the courtesy of this consult. Please call me with any questions or concerns.   Onofrio Klemp, FNP-C [facility] 07/08/2023, 7:43 AM  Cc: Arcadio Knuckles, MD

## 2023-07-12 ENCOUNTER — Other Ambulatory Visit (HOSPITAL_BASED_OUTPATIENT_CLINIC_OR_DEPARTMENT_OTHER): Payer: Self-pay

## 2023-07-12 DIAGNOSIS — F649 Gender identity disorder, unspecified: Secondary | ICD-10-CM | POA: Diagnosis not present

## 2023-07-12 MED ORDER — TESTOSTERONE CYPIONATE 200 MG/ML IM SOLN
100.0000 mg | INTRAMUSCULAR | 1 refills | Status: AC
  Filled 2023-07-12: qty 4, 28d supply, fill #0
  Filled 2023-11-10: qty 4, 28d supply, fill #1
  Filled 2023-12-15: qty 4, 28d supply, fill #2

## 2023-07-21 DIAGNOSIS — F3189 Other bipolar disorder: Secondary | ICD-10-CM | POA: Diagnosis not present

## 2023-07-21 DIAGNOSIS — F330 Major depressive disorder, recurrent, mild: Secondary | ICD-10-CM | POA: Diagnosis not present

## 2023-07-23 ENCOUNTER — Other Ambulatory Visit (HOSPITAL_BASED_OUTPATIENT_CLINIC_OR_DEPARTMENT_OTHER): Payer: Self-pay

## 2023-07-23 MED ORDER — FLUOXETINE HCL 20 MG PO CAPS
20.0000 mg | ORAL_CAPSULE | Freq: Every day | ORAL | 3 refills | Status: DC
  Filled 2023-07-23: qty 30, 30d supply, fill #0
  Filled 2023-08-22: qty 30, 30d supply, fill #1

## 2023-07-24 ENCOUNTER — Other Ambulatory Visit (HOSPITAL_BASED_OUTPATIENT_CLINIC_OR_DEPARTMENT_OTHER): Payer: Self-pay

## 2023-07-27 DIAGNOSIS — F319 Bipolar disorder, unspecified: Secondary | ICD-10-CM | POA: Diagnosis not present

## 2023-07-27 DIAGNOSIS — F330 Major depressive disorder, recurrent, mild: Secondary | ICD-10-CM | POA: Diagnosis not present

## 2023-08-04 DIAGNOSIS — F319 Bipolar disorder, unspecified: Secondary | ICD-10-CM | POA: Diagnosis not present

## 2023-08-05 ENCOUNTER — Ambulatory Visit: Admitting: Internal Medicine

## 2023-08-15 DIAGNOSIS — F330 Major depressive disorder, recurrent, mild: Secondary | ICD-10-CM | POA: Diagnosis not present

## 2023-08-27 ENCOUNTER — Ambulatory Visit: Admitting: Gastroenterology

## 2023-08-27 NOTE — Progress Notes (Deleted)
 Alicia Li

## 2023-08-30 ENCOUNTER — Other Ambulatory Visit (HOSPITAL_BASED_OUTPATIENT_CLINIC_OR_DEPARTMENT_OTHER): Payer: Self-pay

## 2023-09-04 ENCOUNTER — Ambulatory Visit (HOSPITAL_COMMUNITY): Admission: EM | Admit: 2023-09-04 | Discharge: 2023-09-04 | Disposition: A | Discharge status: Home / Self Care

## 2023-09-04 DIAGNOSIS — F329 Major depressive disorder, single episode, unspecified: Secondary | ICD-10-CM

## 2023-09-04 DIAGNOSIS — F4321 Adjustment disorder with depressed mood: Secondary | ICD-10-CM

## 2023-09-04 NOTE — ED Provider Notes (Signed)
 Behavioral Health Urgent Care Medical Screening Exam  Patient Name: Alicia Li MRN: [id number] Date of Evaluation: 09/04/23 Chief Complaint:  I lost my cousin 3 days ago Diagnosis:  Final diagnoses:  Grief  Reactive depression    History of Present illness: Alicia Li 23 y.o., adult transgender female patient presented to [name] as a voluntary walk in unaccompanied with complaints of depressed mood and grief related to the death of his cousin. Alicia Li, is seen face to face by this provider, consulted with Dr. Genita Keys; and chart reviewed on 09/04/23.  Per chart review patient has a past psychiatric history of MDD and gender dysphoria.  He is not currently receiving outpatient mental health services.  His last inpatient hospitalization was at [name] in 2019 following an unintentional overdose on Benadryl.  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 He is currently prescribed Lyrica  50mg  , testosterone  injections and recently started on Prozac  20 mg, however denies taking any psychiatric medications at this time.   On evaluation Alicia Li reports that his cousin died unexpectedly 3 days ago and he has not been able to function at work due to the grief and depressive symptoms that this loss has brought up. Pt reports that he had been doing pretty good for the past couple of years up until the death of his uncle 2 yrs ago. At this time, he noticed a change in his mood and began drinking alcohol daily for a few months after uncle's death. He was able to stop drinking and just dealt with the grief but never really processed through it. He reports that now the recent death of his cousin has brought back a lot of depressive symptoms and he is noticing that he is wanting to start drinking again. He reports daily THC use (1 blunt a day). He states that he feels down, lack of motivation, unable to concentrate, decreased sleep and anhedonia. He denies  suicidal ideations, homicidal ideations and psychotic symptoms. He states I just want to go head and start doing therapy and taking some medications, so that things don't get worse and I can't be more productive at work. Pt reports working at [employer] and is currently living with family. Discussed recommendation of following up with outpatient resources for medication management and therapy services, pt agrees to this plan.   During evaluation Alicia Li is sitting up in waiting area, in no acute distress.  He is alert & oriented x 4, calm, cooperative and attentive for this assessment.  His mood is depressed with congruent affect.  He has normal speech, and behavior.  Objectively there is no evidence of psychosis/mania or delusional thinking. Pt does not appear to be responding to internal or external stimuli.  Patient is able to converse coherently, goal directed thoughts, no distractibility, or pre-occupation.  He also denies suicidal/self-harm/homicidal ideation, psychosis, and paranoia.  Patient answered assessment questions appropriately.    Flowsheet Row ED from 09/04/2023 in [name] ED from 02/10/2023 in [name] ED from 01/11/2022 in [name]  C-SSRS RISK CATEGORY No Risk No Risk No Risk    Psychiatric Specialty Exam  Presentation  General Appearance:Well Groomed  Eye Contact:Good  Speech:Clear and Coherent  Speech Volume:Normal  Handedness:No data recorded  Mood and Affect  Mood: Depressed  Affect: Congruent; Depressed   Thought Process  Thought Processes: Coherent  Descriptions of Associations:Intact  Orientation:Full (Time, Place and Person)  Thought Content:WDL    Hallucinations:None  Ideas of Reference:None  Suicidal Thoughts:No  Homicidal Thoughts:No   Sensorium  Memory: Recent Fair; Immediate  Good  Judgment: Good  Insight: Good   Executive Functions  Concentration: Good  Attention Span: Good  Recall: Fair  Fund of Knowledge: Fair  Language: Fair   Psychomotor Activity  Psychomotor Activity: Normal   Assets  Assets: Manufacturing systems engineer; Desire for Improvement; Financial Resources/Insurance; Housing; Physical Health; Resilience; Social Support; Tax adviser; Talents/Skills   Sleep  Sleep: Fair  Number of hours:  4   Physical Exam: Physical Exam Vitals and nursing note reviewed.  Constitutional:      Appearance: Normal appearance.  HENT:     Head: Normocephalic.     Nose: Nose normal.   Eyes:     Extraocular Movements: Extraocular movements intact.    Cardiovascular:     Rate and Rhythm: Normal rate.  Pulmonary:     Effort: Pulmonary effort is normal.   Musculoskeletal:        General: Normal range of motion.     Cervical back: Normal range of motion.   Neurological:     General: No focal deficit present.     Mental Status: He is alert and oriented to person, place, and time.    Review of Systems  Constitutional: Negative.   HENT: Negative.    Eyes: Negative.   Respiratory: Negative.    Cardiovascular: Negative.   Gastrointestinal: Negative.   Genitourinary: Negative.   Musculoskeletal: Negative.   Neurological: Negative.   Endo/Heme/Allergies: Negative.   Psychiatric/Behavioral:  Positive for depression and substance abuse.    Blood pressure 135/82, pulse 87, temperature 98.4 F (36.9 C), temperature source Oral, resp. rate 16, SpO2 100%. There is no height or weight on file to calculate BMI.  Musculoskeletal: Strength & Muscle Tone: within normal limits Gait & Station: normal Patient leans: N/A   [facility] MSE Discharge Disposition for Follow up and Recommendations: Based on my evaluation the patient does not appear to have an emergency medical condition and can be discharged with resources  and follow up care in outpatient services for Medication Management and Individual Therapy Pt is discharged home with outpatient resources for grief counseling, therapy and medication management.  No safety concerns at time of discharge. Pt denies suicidal ideations and thoughts of dying. He has the support of his family at home and is willing to follow up with outpatient services. He is encouraged to return to [facility] or nearest ED if depression worsens and suicidal ideations present. Pt verbalized understanding.   Davia Erps, NP 09/04/2023, 8:31 AM
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
## 2023-09-04 NOTE — Discharge Instructions (Addendum)
 Discharge recommendations:   Medications: No new medications. Patient is to take medications as prescribed. The patient or patient's guardian is to contact a medical professional and/or outpatient provider to address any new side effects that develop. The patient or the patient's guardian should update outpatient providers of any new medications and/or medication changes.    Outpatient Follow up: Please review list of outpatient resources for psychiatry and counseling. Please follow up with your primary care provider for all medical related needs.    Therapy: We recommend that patient participate in individual therapy to address mental health concerns.   Safety:   The following safety precautions should be taken:   No sharp objects. This includes scissors, razors, scrapers, and putty knives.   Chemicals should be removed and locked up.   Medications should be removed and locked up.   Weapons should be removed and locked up. This includes firearms, knives and instruments that can be used to cause injury.   The patient should abstain from use of illicit substances/drugs and abuse of any medications.  If symptoms worsen or do not continue to improve or if the patient becomes actively suicidal or homicidal then it is recommended that the patient return to the closest hospital emergency department, the [facility], or call 911 for further evaluation and treatment. National Suicide Prevention Lifeline 1-800-SUICIDE or 332-027-6006.  About 988 988 offers 24/7 access to trained crisis counselors who can help people experiencing mental health-related distress. People can call or text 988 or chat 988lifeline.org for themselves or if they are worried about a loved one who may need crisis support.

## 2023-09-04 NOTE — Discharge Summary (Signed)
 Alicia Li to be discharged Home per NP order. An After Visit Summary was printed and given to the patient. Patient escorted out, and discharged home via private auto.  Minus Amel  09/04/2023 8:55 AM

## 2023-10-12 ENCOUNTER — Other Ambulatory Visit (HOSPITAL_BASED_OUTPATIENT_CLINIC_OR_DEPARTMENT_OTHER): Payer: Self-pay

## 2023-10-12 ENCOUNTER — Encounter (HOSPITAL_BASED_OUTPATIENT_CLINIC_OR_DEPARTMENT_OTHER): Payer: Self-pay

## 2023-10-12 ENCOUNTER — Other Ambulatory Visit: Payer: Self-pay | Admitting: Internal Medicine

## 2023-10-12 DIAGNOSIS — L280 Lichen simplex chronicus: Secondary | ICD-10-CM

## 2023-10-12 MED ORDER — TRETINOIN 0.025 % EX GEL
1.0000 | Freq: Every evening | CUTANEOUS | 3 refills | Status: AC
  Filled 2023-10-12: qty 45, 90d supply, fill #0

## 2023-10-12 NOTE — Telephone Encounter (Signed)
 I have not seen them in a year

## 2023-10-12 NOTE — Telephone Encounter (Signed)
 Requesting: Lyrica  50 mg Contract: N/A UDS: N/A Last Visit: 10/06/2022 Next Visit: N/A Last Refill: 10/06/2022  Please Advise

## 2023-10-14 ENCOUNTER — Other Ambulatory Visit (HOSPITAL_BASED_OUTPATIENT_CLINIC_OR_DEPARTMENT_OTHER): Payer: Self-pay

## 2023-10-14 ENCOUNTER — Ambulatory Visit: Admitting: Family Medicine

## 2023-10-14 VITALS — BP 122/80 | HR 79 | Temp 97.7°F | Ht 63.0 in | Wt 134.8 lb

## 2023-10-14 DIAGNOSIS — L280 Lichen simplex chronicus: Secondary | ICD-10-CM

## 2023-10-14 DIAGNOSIS — Z79899 Other long term (current) drug therapy: Secondary | ICD-10-CM | POA: Diagnosis not present

## 2023-10-14 MED ORDER — PREGABALIN 50 MG PO CAPS
50.0000 mg | ORAL_CAPSULE | Freq: Three times a day (TID) | ORAL | 5 refills | Status: AC
  Filled 2023-10-14: qty 90, 30d supply, fill #0
  Filled 2024-02-08 – 2024-03-30 (×3): qty 90, 30d supply, fill #1

## 2023-10-14 NOTE — Patient Instructions (Signed)
 I have refilled Lyrica  for you today  Follow-up with me in 6 mos for medication management, sooner if needed.

## 2023-10-14 NOTE — Progress Notes (Signed)
 Established Patient Office Visit  Subjective:     Patient ID: Alicia Li, adult    DOB: 2001-02-16, 23 y.o.   MRN: [id number]  Chief Complaint  Patient presents with   Medication Refill    HPI  Discussed the use of AI scribe software for clinical note transcription with the patient, who gave verbal consent to proceed.  History of Present Illness Alicia Li is a 23 year old female who presents for a medication refill for nerve pain management.  Neurodermatitis pain - Nerve pain managed with medication - Pain occurs primarily when fatigued - Current regimen: 75 mg dose, effective for symptom control - Medication prescribed three times daily, but typically taken once or twice in the morning based on symptom severity, and occasionally at night - Previously used 25 mg dose, but 75 mg dose provides better efficacy and requires less frequent dosing     ROS Per HPI      Objective:    BP 122/80 (BP Location: Left Arm, Patient Position: Sitting)   Pulse 79   Temp 97.7 F (36.5 C) (Temporal)   Ht 5' 3 (1.6 m)   Wt 134 lb 12.8 oz (61.1 kg)   SpO2 98%   BMI 23.88 kg/m    Physical Exam Vitals and nursing note reviewed.  Constitutional:      General: He is not in acute distress.    Appearance: Normal appearance.  HENT:     Head: Normocephalic and atraumatic.     Right Ear: External ear normal.     Left Ear: External ear normal.     Nose: Nose normal.     Mouth/Throat:     Mouth: Mucous membranes are moist.     Pharynx: Oropharynx is clear.  Eyes:     Extraocular Movements: Extraocular movements intact.  Cardiovascular:     Rate and Rhythm: Normal rate and regular rhythm.  Pulmonary:     Effort: Pulmonary effort is normal.  Musculoskeletal:        General: Normal range of motion.     Cervical back: Normal range of motion.     Right lower leg: No edema.     Left lower leg: No edema.  Lymphadenopathy:     Cervical: No cervical adenopathy.  Skin:     General: Skin is warm and dry.  Neurological:     General: No focal deficit present.     Mental Status: He is alert and oriented to person, place, and time.  Psychiatric:        Mood and Affect: Mood normal.        Behavior: Behavior normal.     No results found for any visits on 10/14/23.  The ASCVD Risk score (Arnett DK, et al., 2019) failed to calculate for the following reasons:   The 2019 ASCVD risk score is only valid for ages 92 to 79  BP Readings from Last 3 Encounters:  10/14/23 122/80  02/10/23 120/77  01/28/23 135/80   Wt Readings from Last 3 Encounters:  10/14/23 134 lb 12.8 oz (61.1 kg)  02/10/23 130 lb (59 kg)  01/28/23 132 lb 8 oz (60.1 kg)      Last CBC Lab Results  Component Value Date   WBC 9.4 10/06/2022   HGB 15.8 (H) 10/06/2022   HCT 51.4 (H) 10/06/2022   MCV 90.8 10/06/2022   MCH 28.1 01/11/2022   RDW 14.1 10/06/2022   PLT 242.0 10/06/2022   Last metabolic panel Lab Results  Component Value Date   GLUCOSE 79 01/13/2022   NA 139 01/13/2022   K 4.3 01/13/2022   CL 102 01/13/2022   CO2 27 01/13/2022   BUN 16 01/13/2022   CREATININE 0.93 01/13/2022   GFR 86.86 01/13/2022   CALCIUM 10.0 01/13/2022   PROT 7.2 01/28/2023   ALBUMIN 4.9 01/11/2022   LABGLOB 2.7 01/28/2023   BILITOT 0.8 01/11/2022   ALKPHOS 67 01/11/2022   AST 17 01/11/2022   ALT 9 01/11/2022   ANIONGAP 12 01/11/2022         Assessment & Plan:   Assessment and Plan Assessment & Plan Neurodermatitis Nerve pain managed effectively with current medication regimen. Flexible dosing with 50 mg capsules allows symptom control. - Refilled prescription for 50 mg capsules to maintain flexible dosing. - Provided six-month supply with refills, requiring follow-up every six months.     No orders of the defined types were placed in this encounter.    Meds ordered this encounter  Medications   pregabalin  (LYRICA ) 50 MG capsule    Sig: Take 1 capsule (50 mg total) by mouth  3 (three) times daily.    Dispense:  270 capsule    Refill:  5    Return in about 6 months (around 04/15/2024) for meds.  Corean LITTIE Ku, FNP

## 2023-11-10 ENCOUNTER — Other Ambulatory Visit (HOSPITAL_BASED_OUTPATIENT_CLINIC_OR_DEPARTMENT_OTHER): Payer: Self-pay

## 2023-11-10 DIAGNOSIS — N898 Other specified noninflammatory disorders of vagina: Secondary | ICD-10-CM | POA: Diagnosis not present

## 2023-11-10 DIAGNOSIS — N760 Acute vaginitis: Secondary | ICD-10-CM | POA: Diagnosis not present

## 2023-11-10 DIAGNOSIS — F3281 Premenstrual dysphoric disorder: Secondary | ICD-10-CM | POA: Diagnosis not present

## 2023-11-10 MED ORDER — FLUOXETINE HCL 40 MG PO CAPS
40.0000 mg | ORAL_CAPSULE | Freq: Every day | ORAL | 0 refills | Status: AC
  Filled 2023-11-10: qty 30, 30d supply, fill #0
  Filled 2023-12-15: qty 30, 30d supply, fill #1
  Filled 2024-02-08 – 2024-03-30 (×3): qty 30, 30d supply, fill #2

## 2023-11-10 MED ORDER — METRONIDAZOLE 500 MG PO TABS
500.0000 mg | ORAL_TABLET | Freq: Two times a day (BID) | ORAL | 0 refills | Status: DC
  Filled 2023-11-10: qty 14, 7d supply, fill #0

## 2023-11-12 ENCOUNTER — Other Ambulatory Visit (HOSPITAL_BASED_OUTPATIENT_CLINIC_OR_DEPARTMENT_OTHER): Payer: Self-pay

## 2023-11-23 ENCOUNTER — Encounter: Payer: Self-pay | Admitting: Gastroenterology

## 2023-12-14 ENCOUNTER — Telehealth: Payer: Self-pay

## 2023-12-14 NOTE — Telephone Encounter (Signed)
 Copied from CRM #[id number]. Topic: Clinical - Request for Lab/Test Order >> Dec 14, 2023 12:57 PM Robinson H wrote: Reason for CRM: Patient needs orders put in system for A1C, patient is scheduled for a top surgery consultation on October 3rd and needs it for that.  Chirs [phone]

## 2023-12-15 ENCOUNTER — Other Ambulatory Visit: Payer: Self-pay

## 2023-12-17 NOTE — Telephone Encounter (Signed)
 Patient has been scheduled

## 2023-12-23 ENCOUNTER — Encounter: Payer: Self-pay | Admitting: Family Medicine

## 2023-12-23 ENCOUNTER — Ambulatory Visit: Admitting: Family Medicine

## 2023-12-23 ENCOUNTER — Ambulatory Visit

## 2023-12-23 VITALS — Ht 63.0 in

## 2023-12-23 DIAGNOSIS — Z01818 Encounter for other preprocedural examination: Secondary | ICD-10-CM

## 2023-12-23 LAB — POCT GLYCOSYLATED HEMOGLOBIN (HGB A1C): Hemoglobin A1C: 4.7 % (ref 4.0–5.6)

## 2023-12-23 NOTE — Progress Notes (Signed)
 Patient presents in office today for A1c check. Needing for pre procedure lab.

## 2023-12-26 ENCOUNTER — Ambulatory Visit: Payer: Self-pay | Admitting: Family Medicine

## 2023-12-26 NOTE — Progress Notes (Signed)
 Pt not seen, lab only

## 2023-12-27 DIAGNOSIS — F649 Gender identity disorder, unspecified: Secondary | ICD-10-CM | POA: Insufficient documentation

## 2024-01-11 ENCOUNTER — Other Ambulatory Visit (HOSPITAL_BASED_OUTPATIENT_CLINIC_OR_DEPARTMENT_OTHER): Payer: Self-pay

## 2024-01-11 ENCOUNTER — Ambulatory Visit

## 2024-01-11 VITALS — Ht 64.0 in | Wt 144.0 lb

## 2024-01-11 DIAGNOSIS — Z800 Family history of malignant neoplasm of digestive organs: Secondary | ICD-10-CM

## 2024-01-11 DIAGNOSIS — Z1509 Genetic susceptibility to other malignant neoplasm: Secondary | ICD-10-CM

## 2024-01-11 DIAGNOSIS — Z15060 Genetic susceptibility to colorectal cancer: Secondary | ICD-10-CM

## 2024-01-11 DIAGNOSIS — Z15068 Genetic susceptibility to other malignant neoplasm of digestive system: Secondary | ICD-10-CM

## 2024-01-11 DIAGNOSIS — Z1507 Genetic susceptibility to malignant neoplasm of urinary tract: Secondary | ICD-10-CM

## 2024-01-11 MED ORDER — NA SULFATE-K SULFATE-MG SULF 17.5-3.13-1.6 GM/177ML PO SOLN
1.0000 | Freq: Once | ORAL | 0 refills | Status: AC
  Filled 2024-01-11 – 2024-02-08 (×3): qty 354, 1d supply, fill #0

## 2024-01-11 NOTE — Progress Notes (Signed)

## 2024-01-20 ENCOUNTER — Encounter: Payer: Self-pay | Admitting: Gastroenterology

## 2024-01-24 ENCOUNTER — Telehealth: Payer: Self-pay | Admitting: Gastroenterology

## 2024-01-24 ENCOUNTER — Other Ambulatory Visit (HOSPITAL_BASED_OUTPATIENT_CLINIC_OR_DEPARTMENT_OTHER): Payer: Self-pay

## 2024-01-24 NOTE — Telephone Encounter (Signed)
 PT called and rescheduled colonoscopy for tomorrow with Dr. Stacia. PT stated that he had  some hiccups preparing. Please advise.

## 2024-01-24 NOTE — Telephone Encounter (Signed)
 Inbound call from patient stating he ate this morning at 8 am 5 slices of pizza and has a procedure scheduled for tomorrow 01/25/24 at 12:30 pm would like to speak to nurse and be advised on what to do if he can continue with procedure  Please advise  Thank you

## 2024-01-24 NOTE — Telephone Encounter (Signed)
 Returned pts call.  Advised him that he is ok to proceed with procedure.  Encouraged him to drink plenty of clear liquids and to avoid any solid foods until post procedure. Reviewed what is considered a clear liquid with patient.

## 2024-01-25 ENCOUNTER — Encounter: Admitting: Gastroenterology

## 2024-02-03 ENCOUNTER — Other Ambulatory Visit (HOSPITAL_BASED_OUTPATIENT_CLINIC_OR_DEPARTMENT_OTHER): Payer: Self-pay

## 2024-02-08 ENCOUNTER — Other Ambulatory Visit (HOSPITAL_BASED_OUTPATIENT_CLINIC_OR_DEPARTMENT_OTHER): Payer: Self-pay

## 2024-02-15 ENCOUNTER — Other Ambulatory Visit (HOSPITAL_BASED_OUTPATIENT_CLINIC_OR_DEPARTMENT_OTHER): Payer: Self-pay

## 2024-02-15 ENCOUNTER — Other Ambulatory Visit: Payer: Self-pay

## 2024-02-16 ENCOUNTER — Ambulatory Visit: Admitting: Gastroenterology

## 2024-02-16 ENCOUNTER — Encounter: Payer: Self-pay | Admitting: Gastroenterology

## 2024-02-16 VITALS — BP 129/58 | HR 66 | Temp 98.1°F | Resp 21 | Ht 63.0 in | Wt 144.0 lb

## 2024-02-16 DIAGNOSIS — D124 Benign neoplasm of descending colon: Secondary | ICD-10-CM

## 2024-02-16 DIAGNOSIS — Z1211 Encounter for screening for malignant neoplasm of colon: Secondary | ICD-10-CM

## 2024-02-16 DIAGNOSIS — K635 Polyp of colon: Secondary | ICD-10-CM

## 2024-02-16 DIAGNOSIS — Z15060 Genetic susceptibility to colorectal cancer: Secondary | ICD-10-CM

## 2024-02-16 DIAGNOSIS — D122 Benign neoplasm of ascending colon: Secondary | ICD-10-CM

## 2024-02-16 DIAGNOSIS — Z800 Family history of malignant neoplasm of digestive organs: Secondary | ICD-10-CM

## 2024-02-16 MED ORDER — SODIUM CHLORIDE 0.9 % IV SOLN: 500.0000 mL | INTRAVENOUS | Status: DC

## 2024-02-16 NOTE — Patient Instructions (Signed)
 Discharge instructions given. Handout on polyps. Resume previous medications. YOU HAD AN ENDOSCOPIC PROCEDURE TODAY AT THE [facility]:   Refer to the procedure report that was given to you for any specific questions about what was found during the examination.  If the procedure report does not answer your questions, please call your gastroenterologist to clarify.  If you requested that your care partner not be given the details of your procedure findings, then the procedure report has been included in a sealed envelope for you to review at your convenience later.  YOU SHOULD EXPECT: Some feelings of bloating in the abdomen. Passage of more gas than usual.  Walking can help get rid of the air that was put into your GI tract during the procedure and reduce the bloating. If you had a lower endoscopy (such as a colonoscopy or flexible sigmoidoscopy) you may notice spotting of blood in your stool or on the toilet paper. If you underwent a bowel prep for your procedure, you may not have a normal bowel movement for a few days.  Please Note:  You might notice some irritation and congestion in your nose or some drainage.  This is from the oxygen used during your procedure.  There is no need for concern and it should clear up in a day or so.  SYMPTOMS TO REPORT IMMEDIATELY:  Following lower endoscopy (colonoscopy or flexible sigmoidoscopy):  Excessive amounts of blood in the stool  Significant tenderness or worsening of abdominal pains  Swelling of the abdomen that is new, acute  Fever of 100F or higher   For urgent or emergent issues, a gastroenterologist can be reached at any hour by calling [phone]. Do not use MyChart messaging for urgent concerns.    DIET:  We do recommend a small meal at first, but then you may proceed to your regular diet.  Drink plenty of fluids but you should avoid alcoholic beverages for 24 hours.  ACTIVITY:  You should plan to take it easy for the rest  of today and you should NOT DRIVE or use heavy machinery until tomorrow (because of the sedation medicines used during the test).    FOLLOW UP: Our staff will call the number listed on your records the next business day following your procedure.  We will call around 7:15- 8:00 am to check on you and address any questions or concerns that you may have regarding the information given to you following your procedure. If we do not reach you, we will leave a message.     If any biopsies were taken you will be contacted by phone or by letter within the next 1-3 weeks.  Please call us at [phone] if you have not heard about the biopsies in 3 weeks.    SIGNATURES/CONFIDENTIALITY: You and/or your care partner have signed paperwork which will be entered into your electronic medical record.  These signatures attest to the fact that that the information above on your After Visit Summary has been reviewed and is understood.  Full responsibility of the confidentiality of this discharge information lies with you and/or your care-partner.

## 2024-02-16 NOTE — Progress Notes (Signed)
 [facility] History and Physical   Primary Care Physician:  Alicia Debby CROME, MD   Reason for Procedure:   High risk colon cancer screening/Lynch syndrome  Plan:    Colonoscopy  The patient was provided an opportunity to ask questions and all were answered. The patient agreed with the plan    HPI: Alicia Li is a 23 y.o. adult undergoing high risk screening colonoscopy.  He was diagnosed with Lynch syndrome (PMS-2 mutation in 2022.  A colonoscopy in 2023 was normal.  He has no chronic GI symptoms.    Past Medical History:  Diagnosis Date   Allergy    Anxiety    Depression    Family history of colon cancer    Family history of Lynch syndrome    Hemorrhoids    Lynch syndrome     Past Surgical History:  Procedure Laterality Date   TONSILLECTOMY Bilateral 2015   WISDOM TOOTH EXTRACTION  2015    Prior to Admission medications   Medication Sig Start Date End Date Taking? Authorizing Provider  FLUoxetine  (PROZAC ) 40 MG capsule Take 1 capsule (40 mg total) by mouth daily. 11/10/23  Yes   Na Sulfate-K Sulfate-Mg Sulfate concentrate (SUPREP) 17.5-3.13-1.6 GM/177ML SOLN Take 1 kit (354 mLs total) by mouth once for 1 dose. May use generic Suprep, 01/11/24 02/16/24 Yes Alicia Glendia BRAVO, MD  testosterone  cypionate (DEPOTESTOSTERONE CYPIONATE) 200 MG/ML injection Inject 0.5 mLs (100 mg total) into the skin once a week. 07/12/23  Yes   clindamycin (CLINDAGEL) 1 % gel  06/02/17   [provider]  pregabalin  (LYRICA ) 50 MG capsule Take 1 capsule (50 mg total) by mouth 3 (three) times daily. 10/14/23   Alicia Corean CROME, FNP  tretinoin  (RETIN-A ) 0.025 % gel  06/02/17   [provider]  tretinoin  (RETIN-A ) 0.025 % gel Apply 1 pea size amount topically to face nightly. 10/12/23       Current Outpatient Medications  Medication Sig Dispense Refill   FLUoxetine  (PROZAC ) 40 MG capsule Take 1 capsule (40 mg total) by mouth daily. 90 capsule 0   Na Sulfate-K  Sulfate-Mg Sulfate concentrate (SUPREP) 17.5-3.13-1.6 GM/177ML SOLN Take 1 kit (354 mLs total) by mouth once for 1 dose. May use generic Suprep, 354 mL 0   testosterone  cypionate (DEPOTESTOSTERONE CYPIONATE) 200 MG/ML injection Inject 0.5 mLs (100 mg total) into the skin once a week. 12 mL 1   clindamycin (CLINDAGEL) 1 % gel      pregabalin  (LYRICA ) 50 MG capsule Take 1 capsule (50 mg total) by mouth 3 (three) times daily. 270 capsule 5   tretinoin  (RETIN-A ) 0.025 % gel      tretinoin  (RETIN-A ) 0.025 % gel Apply 1 pea size amount topically to face nightly. 45 g 3   Current Facility-Administered Medications  Medication Dose Route Frequency Provider Last Rate Last Admin   0.9 %  sodium chloride  infusion  500 mL Intravenous Continuous Alicia Glendia BRAVO, MD        Allergies as of 02/16/2024   (No Known Allergies)    Family History  Problem Relation Age of Onset   Colon polyps Mother    Other Mother        Lynch syndrome - PMS2 pos   Diabetes Father    Other Maternal Aunt        PMS2 neg   Colon polyps Maternal Uncle    Colon cancer Maternal Uncle    Other Maternal Grandmother        Lynch  syndrome   Colon polyps Maternal Grandfather    Colon cancer Maternal Grandfather 55   Heart disease Paternal Grandfather    Diabetes Paternal Grandfather    Cervical cancer Paternal Great-grandmother    Colon cancer Other 35       MGFs brother   Colon cancer Other 1       MGF's brother   Colon cancer Other 15       MGFs sister - MLH1 pos   Esophageal cancer Neg Hx    Rectal cancer Neg Hx    Stomach cancer Neg Hx     Social History   Socioeconomic History   Marital status: Single    Spouse name: Not on file   Number of children: 0   Years of education: Not on file   Highest education level: Associate degree: academic program  Occupational History   Occupation: student/[employer]  Tobacco Use   Smoking status: Some Days    Types: Cigars, Cigarettes   Smokeless tobacco: Never    Tobacco comments:    patient admitted to smoking 1 black and white cigar/day  Vaping Use   Vaping status: Never Used  Substance and Sexual Activity   Alcohol use: Yes    Comment: occasionally   Drug use: Yes    Types: Marijuana    Comment: daily- Last used 11/6   Sexual activity: Not Currently    Partners: Female    Birth control/protection: Abstinence  Other Topics Concern   Not on file  Social History Narrative   ** Merged History Encounter **       Social Drivers of Health   Financial Resource Strain: Low Risk  (10/14/2023)   Overall Financial Resource Strain (CARDIA)    Difficulty of Paying Living Expenses: Not hard at all  Food Insecurity: No Food Insecurity (10/14/2023)   Hunger Vital Sign    Worried About Running Out of Food in the Last Year: Never true    Ran Out of Food in the Last Year: Never true  Transportation Needs: No Transportation Needs (10/14/2023)   PRAPARE - Administrator, Civil Service (Medical): No    Lack of Transportation (Non-Medical): No  Physical Activity: Sufficiently Active (10/14/2023)   Exercise Vital Sign    Days of Exercise per Week: 3 days    Minutes of Exercise per Session: 150+ min  Stress: Stress Concern Present (10/14/2023)   [employer] of Occupational Health - Occupational Stress Questionnaire    Feeling of Stress: To some extent  Social Connections: Moderately Isolated (10/14/2023)   Social Connection and Isolation Panel    Frequency of Communication with Friends and Family: Once a week    Frequency of Social Gatherings with Friends and Family: Twice a week    Attends Religious Services: 1 to 4 times per year    Active Member of [employer] or Organizations: No    Attends Engineer, Structural: Not on file    Marital Status: Never married  Intimate Partner Violence: Not on file    Review of Systems:  All other review of systems negative except as mentioned in the HPI.  Physical Exam: Vital signs BP 136/84    Pulse 87   Temp 98.1 F (36.7 C) (Temporal)   Ht 5' 3 (1.6 m)   Wt 144 lb (65.3 kg)   SpO2 100%   BMI 25.51 kg/m   General:   Alert,  Well-developed, well-nourished, pleasant and cooperative in NAD Airway:  Mallampati 1  Lungs:  Clear throughout to auscultation.   Heart:  Regular rate and rhythm; no murmurs, clicks, rubs,  or gallops. Abdomen:  Soft, nontender and nondistended. Normal bowel sounds.   Neuro/Psych:  Normal mood and affect. A and O x 3   Ashantae Pangallo E. Stacia, MD [facility]

## 2024-02-16 NOTE — Progress Notes (Signed)
 Sedate, gd SR, tolerated procedure well, VSS, report to RN

## 2024-02-16 NOTE — Op Note (Signed)
 [facility] Patient Name: Alicia Li Procedure Date: 02/16/2024 11:59 AM MRN: [id number] Endoscopist: Glendia E. Stacia , MD, [id number] Age: 23 Referring MD:  Date of Birth: 2000/04/28 Gender: Female Account #: [id number] Procedure:                Colonoscopy Indications:              High risk colon cancer surveillance: Personal                            history of hereditary nonpolyposis colorectal                            cancer (Lynch Syndrome) Medicines:                Monitored Anesthesia Care Procedure:                Pre-Anesthesia Assessment:                           - Prior to the procedure, a History and Physical                            was performed, and patient medications and                            allergies were reviewed. The patient's tolerance of                            previous anesthesia was also reviewed. The risks                            and benefits of the procedure and the sedation                            options and risks were discussed with the patient.                            All questions were answered, and informed consent                            was obtained. Prior Anticoagulants: The patient has                            taken no anticoagulant or antiplatelet agents. ASA                            Grade Assessment: II - A patient with mild systemic                            disease. After reviewing the risks and benefits,                            the patient was deemed in satisfactory condition to  undergo the procedure.                           After obtaining informed consent, the colonoscope                            was passed under direct vision. Throughout the                            procedure, the patient's blood pressure, pulse, and                            oxygen saturations were monitored continuously. The                            Olympus Scope [id number] was  introduced through the                            anus and advanced to the the cecum, identified by                            appendiceal orifice and ileocecal valve. The                            colonoscopy was performed without difficulty. The                            patient tolerated the procedure well. The quality                            of the bowel preparation was excellent. The                            ileocecal valve, appendiceal orifice, and rectum                            were photographed. The bowel preparation used was                            SUPREP via split dose instruction. Scope In: 12:12:30 PM Scope Out: 12:31:05 PM Scope Withdrawal Time: 0 hours 13 minutes 58 seconds  Total Procedure Duration: 0 hours 18 minutes 35 seconds  Findings:                 The perianal and digital rectal examinations were                            normal. Pertinent negatives include normal                            sphincter tone and no palpable rectal lesions.                           A 3 mm polyp was found in the proximal ascending  colon. The polyp was flat. The polyp was removed                            with a cold snare. Resection and retrieval were                            complete. Estimated blood loss was minimal.                           A 3 mm polyp was found in the descending colon. The                            polyp was flat. The polyp was removed with a cold                            snare. Resection and retrieval were complete.                            Estimated blood loss was minimal.                           The exam was otherwise normal throughout the                            examined colon.                           The retroflexed view of the distal rectum and anal                            verge was normal and showed no anal or rectal                            abnormalities. Complications:            No immediate  complications. Estimated Blood Loss:     Estimated blood loss was minimal. Impression:               - One 3 mm polyp in the proximal ascending colon,                            removed with a cold snare. Resected and retrieved.                           - One 3 mm polyp in the descending colon, removed                            with a cold snare. Resected and retrieved.                           - The distal rectum and anal verge are normal on                            retroflexion view.  Recommendation:           - Patient has a contact number available for                            emergencies. The signs and symptoms of potential                            delayed complications were discussed with the                            patient. Return to normal activities tomorrow.                            Written discharge instructions were provided to the                            patient.                           - Resume previous diet.                           - Continue present medications.                           - Await pathology results.                           - Repeat colonoscopy in 1-2 years for surveillance. Kaida Games E. Stacia, MD 02/16/2024 12:40:46 PM This report has been signed electronically.

## 2024-02-16 NOTE — Progress Notes (Signed)
 Called to room to assist during endoscopic procedure.  Patient ID and intended procedure confirmed with present staff. Received instructions for my participation in the procedure from the performing physician.

## 2024-02-21 ENCOUNTER — Telehealth: Payer: Self-pay

## 2024-02-21 NOTE — Telephone Encounter (Signed)
Unable to leave message- line busy.

## 2024-02-23 LAB — SURGICAL PATHOLOGY

## 2024-02-24 ENCOUNTER — Ambulatory Visit: Payer: Self-pay | Admitting: Gastroenterology

## 2024-02-24 NOTE — Progress Notes (Signed)
 [place], The two small polyps which I removed during your recent procedure were proven to be completely benign but are considered pre-cancerous polyps that MAY have grown into cancer if they had not been removed.  Based on your history of Lynch syndrome mutation, I recommend that you have a repeat colonoscopy in 2 years.   If you develop any new rectal bleeding, abdominal pain or significant bowel habit changes, please contact me before then.

## 2024-03-13 ENCOUNTER — Other Ambulatory Visit: Payer: Self-pay

## 2024-03-27 ENCOUNTER — Other Ambulatory Visit (HOSPITAL_BASED_OUTPATIENT_CLINIC_OR_DEPARTMENT_OTHER): Payer: Self-pay

## 2024-03-30 ENCOUNTER — Other Ambulatory Visit (HOSPITAL_BASED_OUTPATIENT_CLINIC_OR_DEPARTMENT_OTHER): Payer: Self-pay

## 2024-03-30 ENCOUNTER — Other Ambulatory Visit: Payer: Self-pay

## 2024-04-04 ENCOUNTER — Encounter: Payer: Self-pay | Admitting: Internal Medicine

## 2024-04-04 ENCOUNTER — Other Ambulatory Visit (HOSPITAL_BASED_OUTPATIENT_CLINIC_OR_DEPARTMENT_OTHER): Payer: Self-pay

## 2024-04-04 ENCOUNTER — Ambulatory Visit (INDEPENDENT_AMBULATORY_CARE_PROVIDER_SITE_OTHER): Admitting: Internal Medicine

## 2024-04-04 VITALS — BP 120/76 | HR 94 | Temp 98.5°F | Resp 16 | Ht 63.0 in | Wt 155.8 lb

## 2024-04-04 DIAGNOSIS — Z0001 Encounter for general adult medical examination with abnormal findings: Secondary | ICD-10-CM | POA: Insufficient documentation

## 2024-04-04 DIAGNOSIS — F900 Attention-deficit hyperactivity disorder, predominantly inattentive type: Secondary | ICD-10-CM | POA: Diagnosis not present

## 2024-04-04 DIAGNOSIS — Z23 Encounter for immunization: Secondary | ICD-10-CM | POA: Diagnosis not present

## 2024-04-04 DIAGNOSIS — Z0000 Encounter for general adult medical examination without abnormal findings: Secondary | ICD-10-CM | POA: Diagnosis not present

## 2024-04-04 MED ORDER — METHYLPHENIDATE HCL ER (OSM) 18 MG PO TBCR
18.0000 mg | EXTENDED_RELEASE_TABLET | Freq: Every day | ORAL | 0 refills | Status: AC
  Filled 2024-04-04: qty 30, 30d supply, fill #0

## 2024-04-04 MED ORDER — COVID-19 MRNA VAC-TRIS(PFIZER) 30 MCG/0.3ML IM SUSY
0.3000 mL | PREFILLED_SYRINGE | Freq: Once | INTRAMUSCULAR | 0 refills | Status: AC
  Filled 2024-04-04: qty 0.3, 1d supply, fill #0

## 2024-04-04 NOTE — Patient Instructions (Signed)
 Methylphenidate Extended-Release Tablets What is this medication? METHYLPHENIDATE (meth il FEN i date) treats attention-deficit hyperactivity disorder (ADHD). It works by improving focus and reducing impulsive behavior. It may also be used to treat narcolepsy. It works by promoting wakefulness. It belongs to a group of medications called stimulants. This medicine may be used for other purposes; ask your health care provider or pharmacist if you have questions. COMMON BRAND NAME(S): Concerta, Metadate ER, Methylin, RELEXXII, Ritalin SR What should I tell my care team before I take this medication? They need to know if you have any of these conditions: Anxiety or panic attacks Circulation problems in fingers and toes Glaucoma Heart disease or a heart defect High blood pressure History of substance use disorder Liver disease Mental health condition Motor tics, family history or diagnosis of Tourette's syndrome Seizures Stroke Suicidal thoughts, plans, or attempt by you or a family member Thyroid disease An unusual or allergic reaction to methylphenidate, other medications, foods, dyes, or preservatives Pregnant or trying to get pregnant Breast-feeding How should I use this medication? Take this medication by mouth with water. Take it as directed on the prescription label at the same time every day. Do not crush, cut, or chew this medication. Swallow the tablets whole. You can take this medication with or without food. If you take your medication more than once a day, try to take your last dose at least 8 hours before bedtime. This well help prevent the medication from interfering with your sleep. A special MedGuide will be given to you by the pharmacist with each prescription and refill. Be sure to read this information carefully each time. Talk to your care team about the use of this medication in children. While it may be prescribed for children as young as 6 years for selected conditions,  precautions do apply. Overdosage: If you think you have taken too much of this medicine contact a poison control center or emergency room at once. NOTE: This medicine is only for you. Do not share this medicine with others. What if I miss a dose? If you miss a dose, take it as soon as you can. If it is almost time for your next dose, take only that dose. Do not take double or extra doses. What may interact with this medication? Do not take this medication with any of the following: MAOIs, such as Marplan, Nardil, and Parnate Ozanimod This medication may also interact with the following: Certain medications for blood pressure, heart disease, irregular heartbeat Certain medications for depression, anxiety, or other mental health conditions Certain medications that cause drowsiness before a procedure, such as isoflurane Linezolid Methylene blue Opioids Risperidone St. John's wort This list may not describe all possible interactions. Give your health care provider a list of all the medicines, herbs, non-prescription drugs, or dietary supplements you use. Also tell them if you smoke, drink alcohol, or use illegal drugs. Some items may interact with your medicine. What should I watch for while using this medication? Visit your care team for regular checks on your progress. Tell your care team if your symptoms do not start to get better or if they get worse. This medication requires a new prescription from your care team every time it is filled at the pharmacy. This medication can be abused and cause your brain and body to depend on it after high doses or long term use. Your care team will assess your risk and monitor you closely during treatment. Long term use of this medication may  cause your brain and body to depend on it. You may be able to take breaks from this medication during weekends, holidays, or summer vacations. Talk to your care team about what works for you. If your care team wants you  to stop this medication permanently, the dose may be slowly lowered over time to reduce the risk of side effects. Tell your care team if this medication loses its effects, or if you feel you need to take more than the prescribed amount. Do not change your dose without talking to your care team. Do not take this medication close to bedtime. It may prevent you from sleeping. Loss of appetite is common when starting this medication. Eating small, frequent meals or snacks can help. Talk to your care team if appetite loss persists. Children should have height and weight checked often while taking this medication. Tell your care team right away if you notice unexplained wounds on your fingers and toes while taking this medication. You should also tell your care team if you experience numbness or pain, changes in the skin color, or sensitivity to temperature in your fingers or toes. Contact your care team right away if you have an erection that lasts longer than 4 hours or if it becomes painful. This may be a sign of a serious problem and must be treated right away to prevent permanent damage. If you are going to need surgery, a MRI, CT, or other procedure, tell your care team that you are using this medication. You may need to stop taking this medication before the procedure. The tablet shell for some brands of this medication does not dissolve. This is normal. The tablet shell may appear whole in the stool. This is not a cause for concern. What side effects may I notice from receiving this medication? Side effects that you should report to your care team as soon as possible: Allergic reactions--skin rash, itching, hives, swelling of the face, lips, tongue, or throat Heart attack--pain or tightness in the chest, shoulders, arms, or jaw, nausea, shortness of breath, cold or clammy skin, feeling faint or lightheaded Heart rhythm changes--fast or irregular heartbeat, dizziness, feeling faint or lightheaded, chest  pain, trouble breathing Increase in blood pressure Irritability, confusion, fast or irregular heartbeat, muscle stiffness, twitching muscles, sweating, high fever, seizure, chills, vomiting, diarrhea, which may be signs of serotonin syndrome Mood and behavior changes--anxiety, nervousness, confusion, hallucinations, irritability, hostility, thoughts of suicide or self-harm, worsening mood, feelings of depression Prolonged or painful erection Raynaud syndrome--cool, numb, or painful fingers or toes that may change color from pale, to blue, to red Seizures Stroke--sudden numbness or weakness of the face, arm, or leg, trouble speaking, confusion, trouble walking, loss of balance or coordination, dizziness, severe headache, change in vision Sudden eye pain or change in vision such as blurry vision, seeing halos around lights, vision loss Side effects that usually do not require medical attention (report these to your care team if they continue or are bothersome): Dry mouth Headache Loss of appetite with weight loss Nausea Stomach pain Trouble sleeping This list may not describe all possible side effects. Call your doctor for medical advice about side effects. You may report side effects to FDA at 1-800-FDA-1088. Where should I keep my medication? Keep out of the reach of children and pets. This medication can be abused. Keep it in a safe place to protect it from theft. Do not share it with anyone. It is only for you. Selling or giving away this medication  is dangerous and against the law. Store at room temperature between 15 and 30 degrees C (59 and 86 degrees F). Protect from light and moisture. Keep container tightly closed. Get rid of any unused medication after the expiration date. This medication may cause harm and death if it is taken by other adults, children, or pets. It is important to get rid of the medication as soon as you no longer need it or it is expired. You can do this in two  ways: Take the medication to a medication take-back program. Check with your pharmacy or law enforcement to find a location. If you cannot return the medication, check the label or package insert to see if the medication should be thrown out in the garbage or flushed down the toilet. If you are not sure, ask your care team. If it is safe to put it in the trash, take the medication out of the container. Mix the medication with cat litter, dirt, coffee grounds, or other unwanted substance. Seal the mixture in a bag or container. Put it in the trash. NOTE: This sheet is a summary. It may not cover all possible information. If you have questions about this medicine, talk to your doctor, pharmacist, or health care provider.  2024 Elsevier/Gold Standard (2023-02-19 00:00:00)
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
## 2024-04-27 ENCOUNTER — Ambulatory Visit: Admitting: Internal Medicine
